# Patient Record
Sex: Male | Born: 1990 | Race: White | Hispanic: No | Marital: Single | Smoking: Former smoker
Health system: Southern US, Community
[De-identification: ages and names within clinical notes are randomized; demographics above are authoritative.]

## PROBLEM LIST (undated history)

## (undated) HISTORY — PX: TONSILLECTOMY AND ADENOIDECTOMY: SUR1326

---

## 2021-07-15 ENCOUNTER — Encounter: Payer: Self-pay | Admitting: Internal Medicine

## 2021-07-15 ENCOUNTER — Ambulatory Visit (INDEPENDENT_AMBULATORY_CARE_PROVIDER_SITE_OTHER): Payer: BC Managed Care – PPO | Admitting: Internal Medicine

## 2021-07-15 VITALS — BP 134/88 | HR 105 | Temp 98.2°F | Resp 16 | Ht 67.0 in | Wt 285.8 lb

## 2021-07-15 DIAGNOSIS — Z1159 Encounter for screening for other viral diseases: Secondary | ICD-10-CM

## 2021-07-15 DIAGNOSIS — Z114 Encounter for screening for human immunodeficiency virus [HIV]: Secondary | ICD-10-CM

## 2021-07-15 DIAGNOSIS — R7309 Other abnormal glucose: Secondary | ICD-10-CM | POA: Diagnosis not present

## 2021-07-15 DIAGNOSIS — R1900 Intra-abdominal and pelvic swelling, mass and lump, unspecified site: Secondary | ICD-10-CM | POA: Diagnosis not present

## 2021-07-15 DIAGNOSIS — R030 Elevated blood-pressure reading, without diagnosis of hypertension: Secondary | ICD-10-CM | POA: Diagnosis not present

## 2021-07-15 DIAGNOSIS — Z13220 Encounter for screening for lipoid disorders: Secondary | ICD-10-CM

## 2021-07-15 NOTE — Patient Instructions (Addendum)
It was great seeing you today!  Plan discussed at today's visit: -Blood work ordered today, results will be uploaded to MyChart.  -Abdominal ultrasound ordered, someone will call you to schedule this test  Follow up in: 1 year  Take care and let us know if you have any questions or concerns prior to your next visit.  Dr. Caralee Ates

## 2021-07-15 NOTE — Progress Notes (Signed)
New Patient Office Visit  Subjective:  Patient ID: Brian Bray, male    DOB: [birth year]  Age: 31 y.o. MRN: [id number]  CC:  Chief Complaint  Patient presents with   Establish Care    HPI Brian Bray presents to establish care. He has no chronic medical conditions and takes no medications daily. No history of hospitalizations or surgeries. No known drug allergies. Is concerned today about what he believes is an abdominal hernia, feels an enlargement around his epigastric area that is large in size. It is not painful but doesn't appear to be reducible. It is more noticeable when he is flexing or sitting up. Gets smaller when he lays down. Has not noticed it enlarging lately or changing in anyway. No associated symptoms.   Health Maintenance: -Blood work due -Immunizations: uncertain about history, will try to bring records. Politely declines flu and Tdap today.  History reviewed. No pertinent past medical history.  History reviewed. No pertinent surgical history.  Family History  Problem Relation Age of Onset   Hypertension Brother     Social History   Socioeconomic History   Marital status: Single    Spouse name: Not on file   Number of children: Not on file   Years of education: Not on file   Highest education level: Not on file  Occupational History   Not on file  Tobacco Use   Smoking status: Former    Types: Cigarettes   Smokeless tobacco: Current  Vaping Use   Vaping Use: Every day   Substances: Nicotine  Substance and Sexual Activity   Alcohol use: Yes    Comment: occasionally   Drug use: Never   Sexual activity: Yes  Other Topics Concern   Not on file  Social History Narrative   Not on file   Social Determinants of Health   Financial Resource Strain: Not on file  Food Insecurity: Not on file  Transportation Needs: Not on file  Physical Activity: Not on file  Stress: Not on file  Social Connections: Not on file  Intimate Partner Violence: Not on  file    ROS Review of Systems  Constitutional:  Negative for chills and fever.  Eyes:  Negative for visual disturbance.  Respiratory:  Negative for cough and shortness of breath.   Cardiovascular:  Negative for chest pain and palpitations.  Gastrointestinal:  Negative for abdominal pain, diarrhea, nausea and vomiting.  Genitourinary:  Negative for dysuria and hematuria.  Neurological:  Negative for dizziness and headaches.   Objective:   Today's Vitals: BP 134/88    Pulse (!) 105    Temp 98.2 F (36.8 C)    Resp 16    Ht 5\' 7"  (1.702 m)    Wt 285 lb 12.8 oz (129.6 kg)    SpO2 98%    BMI 44.76 kg/m   Physical Exam Constitutional:      Appearance: Normal appearance. He is obese.  HENT:     Head: Normocephalic and atraumatic.  Eyes:     Conjunctiva/sclera: Conjunctivae normal.  Cardiovascular:     Rate and Rhythm: Normal rate and regular rhythm.  Pulmonary:     Effort: Pulmonary effort is normal.     Breath sounds: Normal breath sounds.  Abdominal:     General: There is no distension.     Palpations: Abdomen is soft.     Tenderness: There is no abdominal tenderness.     Hernia: A hernia is present.  Musculoskeletal:  Right lower leg: No edema.     Left lower leg: No edema.  Skin:    General: Skin is warm and dry.  Neurological:     General: No focal deficit present.     Mental Status: He is alert. Mental status is at baseline.  Psychiatric:        Mood and Affect: Mood normal.        Behavior: Behavior normal.    Assessment & Plan:   1. Abdominal wall bulge: [place] to confirm abdominal wall hernia, it is not bothering the patient now just wanted to  have it evaluated. Due for screening labs, CBC, CMP today.   - CBC w/Diff/Platelet - COMPLETE METABOLIC PANEL WITH GFR - US Abdomen Complete; Future  2. Elevated blood pressure reading: Slightly elevated, discussed risk factors as he has a positive family history of HTN as well.   - CBC w/Diff/Platelet - COMPLETE  METABOLIC PANEL WITH GFR  3. Lipid screening/Need for hepatitis C screening test/Encounter for screening for HIV: Cholesterol, HIV and Hepatitis C screening today.   - Lipid Profile - Hepatitis C Antibody - HIV antibody (with reflex)  Follow-up: Return in about 1 year (around 07/15/2022).   Teodora Medici, DO

## 2021-07-16 LAB — TEST AUTHORIZATION

## 2021-07-16 LAB — CBC WITH DIFFERENTIAL/PLATELET
Absolute Monocytes: 784 cells/uL (ref 200–950)
Basophils Absolute: 101 cells/uL (ref 0–200)
Basophils Relative: 0.9 %
Eosinophils Absolute: 179 cells/uL (ref 15–500)
Eosinophils Relative: 1.6 %
HCT: 50.8 % — ABNORMAL HIGH (ref 38.5–50.0)
Hemoglobin: 17 g/dL (ref 13.2–17.1)
Lymphs Abs: 3136 cells/uL (ref 850–3900)
MCH: 28.7 pg (ref 27.0–33.0)
MCHC: 33.5 g/dL (ref 32.0–36.0)
MCV: 85.7 fL (ref 80.0–100.0)
MPV: 11 fL (ref 7.5–12.5)
Monocytes Relative: 7 %
Neutro Abs: 7000 cells/uL (ref 1500–7800)
Neutrophils Relative %: 62.5 %
Platelets: 268 10*3/uL (ref 140–400)
RBC: 5.93 10*6/uL — ABNORMAL HIGH (ref 4.20–5.80)
RDW: 12 % (ref 11.0–15.0)
Total Lymphocyte: 28 %
WBC: 11.2 10*3/uL — ABNORMAL HIGH (ref 3.8–10.8)

## 2021-07-16 LAB — HEMOGLOBIN A1C
Hgb A1c MFr Bld: 10.8 % of total Hgb — ABNORMAL HIGH (ref <5.7)
Mean Plasma Glucose: 263 mg/dL
eAG (mmol/L): 14.6 mmol/L

## 2021-07-16 LAB — COMPLETE METABOLIC PANEL WITH GFR
AG Ratio: 1.6 (calc) (ref 1.0–2.5)
ALT: 44 U/L (ref 9–46)
AST: 26 U/L (ref 10–40)
Albumin: 4.1 g/dL (ref 3.6–5.1)
Alkaline phosphatase (APISO): 70 U/L (ref 36–130)
BUN: 9 mg/dL (ref 7–25)
CO2: 31 mmol/L (ref 20–32)
Calcium: 9 mg/dL (ref 8.6–10.3)
Chloride: 99 mmol/L (ref 98–110)
Creat: 0.83 mg/dL (ref 0.60–1.24)
Globulin: 2.6 g/dL (calc) (ref 1.9–3.7)
Glucose, Bld: 260 mg/dL — ABNORMAL HIGH (ref 65–99)
Potassium: 4.1 mmol/L (ref 3.5–5.3)
Sodium: 138 mmol/L (ref 135–146)
Total Bilirubin: 0.8 mg/dL (ref 0.2–1.2)
Total Protein: 6.7 g/dL (ref 6.1–8.1)
eGFR: 122 mL/min/{1.73_m2} (ref >=60)

## 2021-07-16 LAB — HEPATITIS C ANTIBODY
Hepatitis C Ab: NONREACTIVE
SIGNAL TO CUT-OFF: 0.03 (ref <1.00)

## 2021-07-16 LAB — LIPID PANEL
Cholesterol: 161 mg/dL (ref <200)
HDL: 42 mg/dL (ref >=40)
LDL Cholesterol (Calc): 96 mg/dL (calc)
Non-HDL Cholesterol (Calc): 119 mg/dL (calc) (ref <130)
Total CHOL/HDL Ratio: 3.8 (calc) (ref <5.0)
Triglycerides: 124 mg/dL (ref <150)

## 2021-07-16 LAB — HIV ANTIBODY (ROUTINE TESTING W REFLEX): HIV 1&2 Ab, 4th Generation: NONREACTIVE

## 2021-07-16 NOTE — Addendum Note (Signed)
Addended by: Teodora Medici on: 07/16/2021 07:59 AM   Modules accepted: Orders

## 2021-07-29 ENCOUNTER — Encounter: Payer: Self-pay | Admitting: Internal Medicine

## 2021-07-29 ENCOUNTER — Ambulatory Visit (INDEPENDENT_AMBULATORY_CARE_PROVIDER_SITE_OTHER): Payer: BC Managed Care – PPO | Admitting: Internal Medicine

## 2021-07-29 VITALS — BP 124/80 | HR 96 | Temp 98.8°F | Resp 16 | Ht 67.0 in | Wt 287.9 lb

## 2021-07-29 DIAGNOSIS — E1165 Type 2 diabetes mellitus with hyperglycemia: Secondary | ICD-10-CM

## 2021-07-29 MED ORDER — METFORMIN HCL 500 MG PO TABS: 500.0000 mg | ORAL_TABLET | Freq: Two times a day (BID) | ORAL | 3 refills | Status: DC

## 2021-07-29 MED ORDER — ROSUVASTATIN CALCIUM 10 MG PO TABS: 10.0000 mg | ORAL_TABLET | Freq: Every day | ORAL | 3 refills | Status: DC

## 2021-07-29 NOTE — Patient Instructions (Addendum)
It was great seeing you today!  Plan discussed at today's visit: -Metformin 500 sent to pharmacy to take once a day for 1 week, then increase to twice a week -Crestor for cholesterol sent to pharmacy as well  -Please work on diet and lifestyle change - decrease carbs and sugars  Follow up in: 3 months   Take care and let us know if you have any questions or concerns prior to your next visit.  Dr. Rosana Berger  Type 2 Diabetes Mellitus, Self-Care, Adult Caring for yourself after you have been diagnosed with type 2 diabetes (type 2 diabetes mellitus) means keeping your blood sugar (glucose) under control with a balance of: Nutrition. Exercise. Lifestyle changes. Medicines or insulin, if needed. Support from your team of health care providers and others. What are the risks? Having type 2 diabetes can put you at risk for other long-term (chronic) conditions, such as heart disease and kidney disease. Your health care provider may prescribe medicines to help prevent complications from diabetes. How to monitor your blood glucose Hands showing right hand using lancet pen on left ring finger, with glucometer in background.  Check your blood glucose every day or as often as told by your health care provider. Have your A1C (hemoglobin A1C) level checked two or more times a year, or as often as told by your health care provider. Your health care provider will set personalized treatment goals for you. Generally, the goal of treatment is to maintain the following blood glucose levels: Before meals: 80-130 mg/dL (4.4-7.2 mmol/L). After meals: below 180 mg/dL (10 mmol/L). A1C level: less than 7%. How to manage hyperglycemia and hypoglycemia Hyperglycemia symptoms Hyperglycemia, also called high blood glucose, occurs when blood glucose is too high. Make sure you know the early signs of hyperglycemia, such as: Increased thirst. Hunger. Feeling very tired. Needing to urinate more often than  usual. Blurry vision. Hypoglycemia symptoms Hypoglycemia, also called low blood glucose, occurs with a blood glucose level at or below 70 mg/dL (3.9 mmol/L). Diabetes medicines lower your blood glucose and can cause hypoglycemia. The risk for hypoglycemia increases during or after exercise, during sleep, during illness, and when skipping meals or not eating for a long time (fasting). It is important to know the symptoms of hypoglycemia and treat it right away. Always have a 15-gram rapid-acting carbohydrate snack with you to treat low blood glucose. Family members and close friends should also know the symptoms and understand how to treat hypoglycemia, in case you are not able to treat yourself. Symptoms may include: Hunger. Anxiety. Sweating and feeling clammy. Dizziness or feeling light-headed. Sleepiness. Increased heart rate. Irritability. Tingling or numbness around the mouth, lips, or tongue. Restless sleep. Severe hypoglycemia is when your blood glucose level is at or below 54 mg/dL (3 mmol/L). Severe hypoglycemia is an emergency. Do not wait to see if the symptoms will go away. Get medical help right away. Call your local emergency services (911 in the [place] Do not drive yourself to the hospital. If you have severe hypoglycemia and you cannot eat or drink, you may need glucagon. A family member or close friend should learn how to check your blood glucose and how to give you glucagon. Ask your health care provider if you need to have an emergency glucagon kit available. Follow these instructions at home: Medicines Take prescribed insulin or diabetes medicines as told by your health care provider. Do not run out of insulin or other diabetes medicines. Plan ahead so you always have  these available. If you use insulin, adjust your dosage based on your physical activity and what foods you eat. Your health care provider will tell you how to adjust your dosage. Take over-the-counter and  prescription medicines only as told by your health care provider. Eating and drinking A plate with examples of foods in a healthy diet.  What you eat and drink affects your blood glucose and your insulin dosage. Making good choices helps to control your diabetes and prevent other health problems. A healthy meal plan includes eating lean proteins, complex carbohydrates, fresh fruits and vegetables, low-fat dairy products, and healthy fats. Make an appointment to see a registered dietitian to help you create an eating plan that is right for you. Make sure that you: Follow instructions from your health care provider about eating or drinking restrictions. Drink enough fluid to keep your urine pale yellow. Keep a record of the carbohydrates that you eat. Do this by reading food labels and learning the standard serving sizes of foods. Follow your sick-day plan whenever you cannot eat or drink as usual. Make this plan in advance with your health care provider.   Activity Stay active. Exercise regularly, as told by your health care provider. This may include: Stretching and doing strength exercises, such as yoga or weight lifting, two or more times a week. Doing 150 minutes or more of moderate-intensity or vigorous-intensity exercise each week. This could be brisk walking, biking, or water aerobics. Spread out your activity over 3 or more days of the week. Do not go more than 2 days in a row without doing some kind of physical activity. When you start a new exercise or activity, work with your health care provider to adjust your insulin, medicines, or food intake as needed. Lifestyle Do not use any products that contain nicotine or tobacco. These products include cigarettes, chewing tobacco, and vaping devices, such as e-cigarettes. If you need help quitting, ask your health care provider. If you drink alcohol and your health care provider says that it is safe for you: Limit how much you have to: 0-1  drink a day for women who are not pregnant. 0-2 drinks a day for men. Know how much alcohol is in your drink. In the [place] one drink equals one 12 oz bottle of beer (355 mL), one 5 oz glass of wine (148 mL), or one 1 oz glass of hard liquor (44 mL). Learn to manage stress. If you need help with this, ask your health care provider. Take care of your body Barefoot person sitting with right leg crossed over left, using handheld mirror to see bottom of right foot.  Keep your immunizations up to date. In addition to getting vaccinations as told by your health care provider, it is recommended that you get vaccinated against the following illnesses: The flu (influenza). Get a flu shot every year. Pneumonia. Hepatitis B. Schedule an eye exam soon after your diagnosis, and then one time every year after that. Check your skin and feet every day for cuts, bruises, redness, blisters, or sores. Schedule a foot exam with your health care provider once every year. Brush your teeth and gums two times a day, and floss one or more times a day. Visit your dentist one or more times every 6 months. Maintain a healthy weight. General instructions Share your diabetes management plan with people in your workplace, school, and household. Carry a medical alert card or wear medical alert jewelry. Keep all follow-up visits. This is important.  Questions to ask your health care provider Should I meet with a certified diabetes care and education specialist? Where can I find a support group for people with diabetes? Where to find more information For help and guidance and for more information about diabetes, please visit: American Diabetes Association (ADA): www.diabetes.org American Association of Diabetes Care and Education Specialists (ADCES): www.diabeteseducator.org International Diabetes Federation (IDF): MemberVerification.ca Summary Caring for yourself after you have been diagnosed with type 2 diabetes (type 2 diabetes  mellitus) means keeping your blood sugar (glucose) under control with a balance of nutrition, exercise, lifestyle changes, and medicine. Check your blood glucose every day, as often as told by your health care provider. Having diabetes can put you at risk for other long-term (chronic) conditions, such as heart disease and kidney disease. Your health care provider may prescribe medicines to help prevent complications from diabetes. Share your diabetes management plan with people in your workplace, school, and household. Keep all follow-up visits. This is important. This information is not intended to replace advice given to you by your health care provider. Make sure you discuss any questions you have with your health care provider. Document Revised: 10/22/2020 Document Reviewed: 10/22/2020 Elsevier Patient Education  [place].

## 2021-07-29 NOTE — Progress Notes (Signed)
Established Patient Office Visit  Subjective:  Patient ID: Brian Bray, male    DOB: [birth year]  Age: 31 y.o. MRN: [id number]  CC:  Chief Complaint  Patient presents with   Diabetes    New onset to start medication    HPI Brian Bray presents for newly diagnosed DM2.   Diabetes, Type 2: -Last A1c 07/15/21 10.8 -Medications: None currently  -Diet: working on cutting down on sugars and carbs -Eye exam: Due, discussed [facility] diabetic eye exam coming to the office -Foot exam: Due -Microalbumin: Due  -Statin: Not currently  -Denies symptoms of hypoglycemia, polyuria, polydipsia, numbness extremities, foot ulcers/trauma. Does endorse fatigue, cold distal extremities.   Lipid Panel     Component Value Date/Time   CHOL 161 07/15/2021 0902   TRIG 124 07/15/2021 0902   HDL 42 07/15/2021 0902   CHOLHDL 3.8 07/15/2021 0902   LDLCALC 96 07/15/2021 0902    No past medical history on file.  No past surgical history on file.  Family History  Problem Relation Age of Onset   Hypertension Brother     Social History   Socioeconomic History   Marital status: Single    Spouse name: Not on file   Number of children: Not on file   Years of education: Not on file   Highest education level: Not on file  Occupational History   Not on file  Tobacco Use   Smoking status: Former    Types: Cigarettes   Smokeless tobacco: Current  Vaping Use   Vaping Use: Every day   Substances: Nicotine  Substance and Sexual Activity   Alcohol use: Yes    Comment: occasionally   Drug use: Never   Sexual activity: Yes  Other Topics Concern   Not on file  Social History Narrative   Not on file   Social Determinants of Health   Financial Resource Strain: Not on file  Food Insecurity: Not on file  Transportation Needs: Not on file  Physical Activity: Not on file  Stress: Not on file  Social Connections: Not on file  Intimate Partner Violence: Not on file    No outpatient medications prior  to visit.   No facility-administered medications prior to visit.    No Known Allergies  ROS Review of Systems  Constitutional:  Positive for fatigue. Negative for chills and fever.  Eyes:  Negative for visual disturbance.  Respiratory:  Negative for cough and shortness of breath.   Cardiovascular:  Negative for chest pain.  Gastrointestinal:  Negative for abdominal pain.     Objective:    Physical Exam Constitutional:      Appearance: Normal appearance. He is obese.  HENT:     Head: Normocephalic and atraumatic.  Eyes:     Conjunctiva/sclera: Conjunctivae normal.  Cardiovascular:     Rate and Rhythm: Normal rate and regular rhythm.  Pulmonary:     Effort: Pulmonary effort is normal.     Breath sounds: Normal breath sounds.  Musculoskeletal:     Right lower leg: No edema.     Left lower leg: No edema.  Skin:    General: Skin is warm and dry.  Neurological:     General: No focal deficit present.     Mental Status: He is alert. Mental status is at baseline.  Psychiatric:        Mood and Affect: Mood normal.        Behavior: Behavior normal.    BP 124/80    Pulse  96    Temp 98.8 F (37.1 C)    Resp 16    Ht $R'5\' 7"'pd$  (1.702 m)    Wt 287 lb 14.4 oz (130.6 kg)    SpO2 99%    BMI 45.09 kg/m  Wt Readings from Last 3 Encounters:  07/29/21 287 lb 14.4 oz (130.6 kg)  07/15/21 285 lb 12.8 oz (129.6 kg)     Health Maintenance Due  Topic Date Due   COVID-19 Vaccine (3 - Booster for Pfizer series) 12/06/2019    There are no preventive care reminders to display for this patient.  No results found for: TSH Lab Results  Component Value Date   WBC 11.2 (H) 07/15/2021   HGB 17.0 07/15/2021   HCT 50.8 (H) 07/15/2021   MCV 85.7 07/15/2021   PLT 268 07/15/2021   Lab Results  Component Value Date   NA 138 07/15/2021   K 4.1 07/15/2021   CO2 31 07/15/2021   GLUCOSE 260 (H) 07/15/2021   BUN 9 07/15/2021   CREATININE 0.83 07/15/2021   BILITOT 0.8 07/15/2021   AST 26  07/15/2021   ALT 44 07/15/2021   PROT 6.7 07/15/2021   CALCIUM 9.0 07/15/2021   EGFR 122 07/15/2021   Lab Results  Component Value Date   CHOL 161 07/15/2021   Lab Results  Component Value Date   HDL 42 07/15/2021   Lab Results  Component Value Date   LDLCALC 96 07/15/2021   Lab Results  Component Value Date   TRIG 124 07/15/2021   Lab Results  Component Value Date   CHOLHDL 3.8 07/15/2021   Lab Results  Component Value Date   HGBA1C 10.8 (H) 07/15/2021      Assessment & Plan:   Problem List Items Addressed This Visit       Endocrine   Type 2 diabetes mellitus with hyperglycemia, without long-term current use of insulin ([place]) - Primary    Newly diagnosed with recent labs, A1c 10.5%. Start on Metformin 500 BID and Crestor 10. Discussed etiology and management of diabetes. Follow up next time for repeat A1c, microalbumin and foot exam. Discussed eye exam fair coming to office in the next few weeks. Discussed lifestyle management and diet change. Follow up in 3 months.      Relevant Medications   metFORMIN (GLUCOPHAGE) 500 MG tablet   rosuvastatin (CRESTOR) 10 MG tablet    Meds ordered this encounter  Medications   metFORMIN (GLUCOPHAGE) 500 MG tablet    Sig: Take 1 tablet (500 mg total) by mouth 2 (two) times daily with a meal.    Dispense:  180 tablet    Refill:  3   rosuvastatin (CRESTOR) 10 MG tablet    Sig: Take 1 tablet (10 mg total) by mouth daily.    Dispense:  90 tablet    Refill:  3    Follow-up: Return in about 3 months (around 10/26/2021).    Teodora Medici, DO

## 2021-07-29 NOTE — Assessment & Plan Note (Signed)
Newly diagnosed with recent labs, A1c 10.5%. Start on Metformin 500 BID and Crestor 10. Discussed etiology and management of diabetes. Follow up next time for repeat A1c, microalbumin and foot exam. Discussed eye exam fair coming to office in the next few weeks. Discussed lifestyle management and diet change. Follow up in 3 months.

## 2021-08-06 ENCOUNTER — Telehealth: Payer: Self-pay | Admitting: Internal Medicine

## 2021-08-06 DIAGNOSIS — E1165 Type 2 diabetes mellitus with hyperglycemia: Secondary | ICD-10-CM

## 2021-08-06 MED ORDER — ATORVASTATIN CALCIUM 20 MG PO TABS: 20.0000 mg | ORAL_TABLET | Freq: Every day | ORAL | 3 refills | Status: DC

## 2021-08-06 NOTE — Telephone Encounter (Signed)
Pt.notified

## 2021-08-06 NOTE — Telephone Encounter (Signed)
Message says this medication is on back order

## 2021-08-06 NOTE — Telephone Encounter (Signed)
Requested medication (s) are due for refill today: signed 07/29/21 ? ?Requested medication (s) are on the active medication list: yes  ? ?Last refill:  07/29/21 #90 3 refills ? ?Future visit scheduled: yes in 2 months ? ?Notes to clinic:  Pharmacy comment: This medication is on backorder. Please advise ? ? ?  ?Requested Prescriptions  ?Pending Prescriptions Disp Refills  ? rosuvastatin (CRESTOR) 10 MG tablet [Pharmacy Med Name: ROSUVASTATIN 10MG  TAB] 90 tablet 3  ?  Sig: TAKE 1 TABLET BY MOUTH ONCE DAILY  ?  ? Cardiovascular:  Antilipid - Statins 2 Failed - 08/06/2021  8:58 AM  ?  ?  Failed - Lipid Panel in normal range within the last 12 months  ?  Cholesterol  ?Date Value Ref Range Status  ?07/15/2021 161 <200 mg/dL Final  ? ?LDL Cholesterol (Calc)  ?Date Value Ref Range Status  ?07/15/2021 96 mg/dL (calc) Final  ?  Comment:  ?  Reference range: <100 ?09/12/2021 ?Desirable range <100 mg/dL for primary prevention;   ?<70 mg/dL for patients with CHD or diabetic patients  ?with > or = 2 CHD risk factors. ?. ?LDL-C is now calculated using the Martin-Hopkins  ?calculation, which is a validated novel method providing  ?better accuracy than the Friedewald equation in the  ?estimation of LDL-C.  ?Marland Kitchen et al. Horald Pollen. Lenox Ahr): 2061-2068  ?(http://education.[facility].com/faq/FAQ164) ?  ? ?HDL  ?Date Value Ref Range Status  ?07/15/2021 42 > OR = 40 mg/dL Final  ? ?Triglycerides  ?Date Value Ref Range Status  ?07/15/2021 124 <150 mg/dL Final  ? ?  ?  ?  Passed - Cr in normal range and within 360 days  ?  Creat  ?Date Value Ref Range Status  ?07/15/2021 0.83 0.60 - 1.24 mg/dL Final  ?  ?  ?  ?  Passed - Patient is not pregnant  ?  ?  Passed - Valid encounter within last 12 months  ?  Recent Outpatient Visits   ? ?      ? 1 week ago Type 2 diabetes mellitus with hyperglycemia, without long-term current use of insulin (HCC)  ? [facility], DO  ? 3 weeks ago Abdominal wall bulge  ? [facility], DO  ? ?  ?  ?Future Appointments   ? ?        ? In 2 months Margarita Mail, DO [facility]  ? ?  ? ?  ?  ?  ? ?

## 2021-08-06 NOTE — Telephone Encounter (Signed)
FYI crestor on backorder do you want to change? ?

## 2021-08-07 ENCOUNTER — Ambulatory Visit
Admission: RE | Admit: 2021-08-07 | Discharge: 2021-08-07 | Disposition: A | Discharge status: Home / Self Care | Payer: BC Managed Care – PPO | Source: Ambulatory Visit | Attending: Internal Medicine | Admitting: Internal Medicine

## 2021-08-07 DIAGNOSIS — R1900 Intra-abdominal and pelvic swelling, mass and lump, unspecified site: Secondary | ICD-10-CM | POA: Insufficient documentation

## 2021-08-07 DIAGNOSIS — K429 Umbilical hernia without obstruction or gangrene: Secondary | ICD-10-CM | POA: Diagnosis not present

## 2021-10-26 ENCOUNTER — Ambulatory Visit: Payer: BC Managed Care – PPO | Admitting: Internal Medicine

## 2021-10-27 ENCOUNTER — Ambulatory Visit (INDEPENDENT_AMBULATORY_CARE_PROVIDER_SITE_OTHER): Payer: BC Managed Care – PPO | Admitting: Internal Medicine

## 2021-10-27 ENCOUNTER — Encounter: Payer: Self-pay | Admitting: Internal Medicine

## 2021-10-27 VITALS — BP 124/82 | HR 111 | Temp 98.4°F | Resp 16 | Ht 67.0 in | Wt 286.4 lb

## 2021-10-27 DIAGNOSIS — N529 Male erectile dysfunction, unspecified: Secondary | ICD-10-CM

## 2021-10-27 DIAGNOSIS — E1165 Type 2 diabetes mellitus with hyperglycemia: Secondary | ICD-10-CM | POA: Diagnosis not present

## 2021-10-27 LAB — POCT GLYCOSYLATED HEMOGLOBIN (HGB A1C): Hemoglobin A1C: 8.5 % — AB (ref 4.0–5.6)

## 2021-10-27 MED ORDER — SILDENAFIL CITRATE 50 MG PO TABS: 50.0000 mg | ORAL_TABLET | Freq: Every day | ORAL | 0 refills | Status: DC | PRN

## 2021-10-27 NOTE — Patient Instructions (Addendum)
It was great seeing you today!  Plan discussed at today's visit: -A1c better at 8.5% -Continue Metformin 500 mg but increase to three times a day for 2 weeks, then increased to 4 pills a day  Follow up in: 3 months   Take care and let us know if you have any questions or concerns prior to your next visit.  Dr. Caralee Ates

## 2021-10-27 NOTE — Progress Notes (Signed)
Established Patient Office Visit  Subjective:  Patient ID: Brian Bray, male    DOB: [birth year]  Age: 31 y.o. MRN: [id number]  CC:  Chief Complaint  Patient presents with   Follow-up   Hyperlipidemia   Diabetes    HPI Brian Bray presents for follow up on chronic medical conditions.   Diabetes, Type 2: -Last A1c 07/15/21 10.8, 8.5 in the office today -Medications: Metformin 500 mg BID, tolerating well.  Did have some GI side effects at first but this has normalized. -Diet: decreased soda and sugar in diet -Eye exam: Due -Foot exam: Due -Microalbumin: Due  -Statin: Not currently -Denies symptoms of hypoglycemia, polyuria, polydipsia, numbness extremities, foot ulcers/trauma. Does endorse fatigue, cold distal extremities.   HLD: -Medications: Not on any statins currently.  Was given a prescription for Lipitor, however the patient did not pick this up.  He states he does not want to take medication for cholesterol at this time. -Last lipid panel:   Lipid Panel     Component Value Date/Time   CHOL 161 07/15/2021 0902   TRIG 124 07/15/2021 0902   HDL 42 07/15/2021 0902   CHOLHDL 3.8 07/15/2021 0902   LDLCALC 96 07/15/2021 0902   ED: -States he has noticed difficulty maintaining an erection, would like to discuss medication options.  This is new for him.  History reviewed. No pertinent past medical history.  History reviewed. No pertinent surgical history.  Family History  Problem Relation Age of Onset   Hypertension Brother     Social History   Socioeconomic History   Marital status: Single    Spouse name: Not on file   Number of children: Not on file   Years of education: Not on file   Highest education level: Not on file  Occupational History   Not on file  Tobacco Use   Smoking status: Former    Types: Cigarettes   Smokeless tobacco: Current  Vaping Use   Vaping Use: Every day   Substances: Nicotine  Substance and Sexual Activity   Alcohol use: Yes     Comment: occasionally   Drug use: Never   Sexual activity: Yes  Other Topics Concern   Not on file  Social History Narrative   Not on file   Social Determinants of Health   Financial Resource Strain: Not on file  Food Insecurity: Not on file  Transportation Needs: Not on file  Physical Activity: Not on file  Stress: Not on file  Social Connections: Not on file  Intimate Partner Violence: Not on file    Outpatient Medications Prior to Visit  Medication Sig Dispense Refill   metFORMIN (GLUCOPHAGE) 500 MG tablet Take 1 tablet (500 mg total) by mouth 2 (two) times daily with a meal. 180 tablet 3   atorvastatin (LIPITOR) 20 MG tablet Take 1 tablet (20 mg total) by mouth daily. (Patient not taking: Reported on 10/27/2021) 90 tablet 3   No facility-administered medications prior to visit.    No Known Allergies  ROS Review of Systems  Constitutional:  Negative for chills and fever.  Eyes:  Negative for visual disturbance.  Respiratory:  Negative for cough and shortness of breath.   Cardiovascular:  Negative for chest pain.  Gastrointestinal:  Negative for abdominal pain, constipation, diarrhea, nausea and vomiting.     Objective:    Physical Exam Constitutional:      Appearance: Normal appearance. He is obese.  HENT:     Head: Normocephalic and atraumatic.  Eyes:  Conjunctiva/sclera: Conjunctivae normal.  Cardiovascular:     Rate and Rhythm: Normal rate and regular rhythm.     Pulses:          Dorsalis pedis pulses are 2+ on the right side and 2+ on the left side.  Pulmonary:     Effort: Pulmonary effort is normal.     Breath sounds: Normal breath sounds.  Musculoskeletal:     Right lower leg: No edema.     Left lower leg: No edema.     Right foot: Normal range of motion. No deformity, bunion, Charcot foot, foot drop or prominent metatarsal heads.     Left foot: Normal range of motion. No deformity, bunion, Charcot foot, foot drop or prominent metatarsal heads.   Feet:     Right foot:     Protective Sensation: 6 sites tested.  6 sites sensed.     Skin integrity: Skin integrity normal.     Toenail Condition: Right toenails are normal.     Left foot:     Protective Sensation: 6 sites tested.  6 sites sensed.     Skin integrity: Skin integrity normal.     Toenail Condition: Left toenails are normal.  Skin:    General: Skin is warm and dry.  Neurological:     General: No focal deficit present.     Mental Status: He is alert. Mental status is at baseline.  Psychiatric:        Mood and Affect: Mood normal.        Behavior: Behavior normal.    BP 124/82   Pulse (!) 111   Temp 98.4 F (36.9 C)   Resp 16   Ht 5\' 7"  (1.702 m)   Wt 286 lb 6.4 oz (129.9 kg)   SpO2 98%   BMI 44.86 kg/m  Wt Readings from Last 3 Encounters:  10/27/21 286 lb 6.4 oz (129.9 kg)  07/29/21 287 lb 14.4 oz (130.6 kg)  07/15/21 285 lb 12.8 oz (129.6 kg)     Health Maintenance Due  Topic Date Due   OPHTHALMOLOGY EXAM  Never done   URINE MICROALBUMIN  Never done   COVID-19 Vaccine (3 - Booster for Pfizer series) 12/06/2019    There are no preventive care reminders to display for this patient.  No results found for: TSH Lab Results  Component Value Date   WBC 11.2 (H) 07/15/2021   HGB 17.0 07/15/2021   HCT 50.8 (H) 07/15/2021   MCV 85.7 07/15/2021   PLT 268 07/15/2021   Lab Results  Component Value Date   NA 138 07/15/2021   K 4.1 07/15/2021   CO2 31 07/15/2021   GLUCOSE 260 (H) 07/15/2021   BUN 9 07/15/2021   CREATININE 0.83 07/15/2021   BILITOT 0.8 07/15/2021   AST 26 07/15/2021   ALT 44 07/15/2021   PROT 6.7 07/15/2021   CALCIUM 9.0 07/15/2021   EGFR 122 07/15/2021   Lab Results  Component Value Date   CHOL 161 07/15/2021   Lab Results  Component Value Date   HDL 42 07/15/2021   Lab Results  Component Value Date   LDLCALC 96 07/15/2021   Lab Results  Component Value Date   TRIG 124 07/15/2021   Lab Results  Component Value Date    CHOLHDL 3.8 07/15/2021   Lab Results  Component Value Date   HGBA1C 8.5 (A) 10/27/2021      Assessment & Plan:   Problem List Items Addressed This Visit  Endocrine   Type 2 diabetes mellitus with hyperglycemia, without long-term current use of insulin (HCC) - Primary    A1c improved today at 8.5%, however not at goal yet.  We will increase metformin to 1000 mg twice daily.  Did discuss GLP-1's and SGLT2's, at this point we will increase the metformin as he is tolerating this well, follow-up in 3 months and recheck A1c at that time.  He will continue to work on diet and exercise.  Foot exam done today.  Referral for eye exam placed as well.  Due for microalbumin at follow-up.  The patient is hesitant to start a statin, at this point we will continue lifestyle modifications with plans to recheck lipid panel next February.       Relevant Orders   POCT HgB A1C (Completed)   HM Diabetes Foot Exam (Completed)   Ambulatory referral to Ophthalmology     Other   Vasculogenic erectile dysfunction    Discussed how this can be a side effect of having uncontrolled diabetes.  Treatment would include continuing lifestyle modifications, changing diet and increasing exercise and overall controlling diabetes.  Will send Viagra to try to take as needed.       Relevant Medications   sildenafil (VIAGRA) 50 MG tablet   Meds ordered this encounter  Medications   sildenafil (VIAGRA) 50 MG tablet    Sig: Take 1 tablet (50 mg total) by mouth daily as needed for erectile dysfunction.    Dispense:  30 tablet    Refill:  0    Follow-up: Return in about 3 months (around 01/27/2022).    Teodora Medici, DO

## 2021-10-27 NOTE — Assessment & Plan Note (Addendum)
A1c improved today at 8.5%, however not at goal yet.  We will increase metformin to 1000 mg twice daily.  Did discuss GLP-1's and SGLT2's, at this point we will increase the metformin as he is tolerating this well, follow-up in 3 months and recheck A1c at that time.  He will continue to work on diet and exercise.  Foot exam done today.  Referral for eye exam placed as well.  Due for microalbumin at follow-up.  The patient is hesitant to start a statin, at this point we will continue lifestyle modifications with plans to recheck lipid panel next February.

## 2021-10-27 NOTE — Assessment & Plan Note (Signed)
Discussed how this can be a side effect of having uncontrolled diabetes.  Treatment would include continuing lifestyle modifications, changing diet and increasing exercise and overall controlling diabetes.  Will send Viagra to try to take as needed.

## 2021-11-01 ENCOUNTER — Encounter: Payer: Self-pay | Admitting: Internal Medicine

## 2021-11-01 DIAGNOSIS — E1165 Type 2 diabetes mellitus with hyperglycemia: Secondary | ICD-10-CM

## 2021-11-03 MED ORDER — METFORMIN HCL 500 MG PO TABS: 500.0000 mg | ORAL_TABLET | Freq: Three times a day (TID) | ORAL | 3 refills | Status: DC

## 2021-12-23 DIAGNOSIS — E119 Type 2 diabetes mellitus without complications: Secondary | ICD-10-CM | POA: Diagnosis not present

## 2021-12-23 DIAGNOSIS — Z0100 Encounter for examination of eyes and vision without abnormal findings: Secondary | ICD-10-CM | POA: Diagnosis not present

## 2021-12-23 LAB — HM DIABETES EYE EXAM

## 2022-01-07 ENCOUNTER — Other Ambulatory Visit: Payer: Self-pay | Admitting: Internal Medicine

## 2022-01-07 ENCOUNTER — Other Ambulatory Visit: Payer: Self-pay

## 2022-01-07 ENCOUNTER — Encounter: Payer: Self-pay | Admitting: Internal Medicine

## 2022-01-07 DIAGNOSIS — E1165 Type 2 diabetes mellitus with hyperglycemia: Secondary | ICD-10-CM

## 2022-01-07 MED ORDER — METFORMIN HCL 1000 MG PO TABS: 1000.0000 mg | ORAL_TABLET | Freq: Two times a day (BID) | ORAL | 0 refills | Status: DC

## 2022-01-26 NOTE — Progress Notes (Unsigned)
Established Patient Office Visit  Subjective:  Patient ID: Brian Bray, male    DOB: 01/04/1991  Age: 31 y.o. MRN: [id number]  CC:  No chief complaint on file.   HPI Massai Hankerson presents for follow up on chronic medical conditions.   Diabetes, Type 2: -Last A1c 5/23 8.5% -Medications: Metformin 1000 mg BID (increased at LOV), tolerating well.  Did have some GI side effects at first but this has normalized. -Diet: decreased soda and sugar in diet -Eye exam: Due -Foot exam: Due -Microalbumin: Due  -Statin: Not currently -Denies symptoms of hypoglycemia, polyuria, polydipsia, numbness extremities, foot ulcers/trauma. Does endorse fatigue, cold distal extremities.   HLD: -Medications: Not on any statins currently.  Was given a prescription for Lipitor, however the patient did not pick this up.  He states he does not want to take medication for cholesterol at this time. -Last lipid panel:   Lipid Panel     Component Value Date/Time   CHOL 161 07/15/2021 0902   TRIG 124 07/15/2021 0902   HDL 42 07/15/2021 0902   CHOLHDL 3.8 07/15/2021 0902   LDLCALC 96 07/15/2021 0902   ED: -States he has noticed difficulty maintaining an erection, would like to discuss medication options.  This is new for him.  No past medical history on file.  No past surgical history on file.  Family History  Problem Relation Age of Onset   Hypertension Brother     Social History   Socioeconomic History   Marital status: Single    Spouse name: Not on file   Number of children: Not on file   Years of education: Not on file   Highest education level: Not on file  Occupational History   Not on file  Tobacco Use   Smoking status: Former    Types: Cigarettes   Smokeless tobacco: Current  Vaping Use   Vaping Use: Every day   Substances: Nicotine  Substance and Sexual Activity   Alcohol use: Yes    Comment: occasionally   Drug use: Never   Sexual activity: Yes  Other Topics Concern   Not  on file  Social History Narrative   Not on file   Social Determinants of Health   Financial Resource Strain: Not on file  Food Insecurity: Not on file  Transportation Needs: Not on file  Physical Activity: Not on file  Stress: Not on file  Social Connections: Not on file  Intimate Partner Violence: Not on file    Outpatient Medications Prior to Visit  Medication Sig Dispense Refill   metFORMIN (GLUCOPHAGE) 1000 MG tablet Take 1 tablet (1,000 mg total) by mouth 2 (two) times daily with a meal. 180 tablet 0   sildenafil (VIAGRA) 50 MG tablet Take 1 tablet (50 mg total) by mouth daily as needed for erectile dysfunction. 30 tablet 0   No facility-administered medications prior to visit.    No Known Allergies  ROS Review of Systems  Constitutional:  Negative for chills and fever.  Eyes:  Negative for visual disturbance.  Respiratory:  Negative for cough and shortness of breath.   Cardiovascular:  Negative for chest pain.  Gastrointestinal:  Negative for abdominal pain, constipation, diarrhea, nausea and vomiting.      Objective:    Physical Exam Constitutional:      Appearance: Normal appearance. He is obese.  HENT:     Head: Normocephalic and atraumatic.  Eyes:     Conjunctiva/sclera: Conjunctivae normal.  Cardiovascular:     Rate  and Rhythm: Normal rate and regular rhythm.     Pulses:          Dorsalis pedis pulses are 2+ on the right side and 2+ on the left side.  Pulmonary:     Effort: Pulmonary effort is normal.     Breath sounds: Normal breath sounds.  Musculoskeletal:     Right lower leg: No edema.     Left lower leg: No edema.     Right foot: Normal range of motion. No deformity, bunion, Charcot foot, foot drop or prominent metatarsal heads.     Left foot: Normal range of motion. No deformity, bunion, Charcot foot, foot drop or prominent metatarsal heads.  Feet:     Right foot:     Protective Sensation: 6 sites tested.  6 sites sensed.     Skin integrity:  Skin integrity normal.     Toenail Condition: Right toenails are normal.     Left foot:     Protective Sensation: 6 sites tested.  6 sites sensed.     Skin integrity: Skin integrity normal.     Toenail Condition: Left toenails are normal.  Skin:    General: Skin is warm and dry.  Neurological:     General: No focal deficit present.     Mental Status: He is alert. Mental status is at baseline.  Psychiatric:        Mood and Affect: Mood normal.        Behavior: Behavior normal.     There were no vitals taken for this visit. Wt Readings from Last 3 Encounters:  10/27/21 286 lb 6.4 oz (129.9 kg)  07/29/21 287 lb 14.4 oz (130.6 kg)  07/15/21 285 lb 12.8 oz (129.6 kg)     Health Maintenance Due  Topic Date Due   URINE MICROALBUMIN  Never done   COVID-19 Vaccine (3 - Pfizer series) 12/06/2019   INFLUENZA VACCINE  01/05/2022    There are no preventive care reminders to display for this patient.  No results found for: "TSH" Lab Results  Component Value Date   WBC 11.2 (H) 07/15/2021   HGB 17.0 07/15/2021   HCT 50.8 (H) 07/15/2021   MCV 85.7 07/15/2021   PLT 268 07/15/2021   Lab Results  Component Value Date   NA 138 07/15/2021   K 4.1 07/15/2021   CO2 31 07/15/2021   GLUCOSE 260 (H) 07/15/2021   BUN 9 07/15/2021   CREATININE 0.83 07/15/2021   BILITOT 0.8 07/15/2021   AST 26 07/15/2021   ALT 44 07/15/2021   PROT 6.7 07/15/2021   CALCIUM 9.0 07/15/2021   EGFR 122 07/15/2021   Lab Results  Component Value Date   CHOL 161 07/15/2021   Lab Results  Component Value Date   HDL 42 07/15/2021   Lab Results  Component Value Date   LDLCALC 96 07/15/2021   Lab Results  Component Value Date   TRIG 124 07/15/2021   Lab Results  Component Value Date   CHOLHDL 3.8 07/15/2021   Lab Results  Component Value Date   HGBA1C 8.5 (A) 10/27/2021      Assessment & Plan:   Problem List Items Addressed This Visit   None No orders of the defined types were placed in  this encounter.   Follow-up: No follow-ups on file.    Teodora Medici, DO

## 2022-01-27 ENCOUNTER — Encounter: Payer: Self-pay | Admitting: Internal Medicine

## 2022-01-27 ENCOUNTER — Ambulatory Visit: Payer: BC Managed Care – PPO | Admitting: Internal Medicine

## 2022-01-27 VITALS — BP 138/84 | HR 111 | Temp 98.1°F | Resp 16 | Ht 67.0 in | Wt 289.7 lb

## 2022-01-27 DIAGNOSIS — E1165 Type 2 diabetes mellitus with hyperglycemia: Secondary | ICD-10-CM | POA: Diagnosis not present

## 2022-01-27 LAB — POCT GLYCOSYLATED HEMOGLOBIN (HGB A1C): Hemoglobin A1C: 8.2 % — AB (ref 4.0–5.6)

## 2022-01-27 MED ORDER — EMPAGLIFLOZIN 10 MG PO TABS: 10.0000 mg | ORAL_TABLET | Freq: Every day | ORAL | 3 refills | Status: DC

## 2022-01-27 NOTE — Patient Instructions (Addendum)
It was great seeing you today!  Plan discussed at today's visit: -A1c 8.2% -Continue Metformin 1000 mg BID -New medication Jardiance sent to pharmacy  Follow up in: 3 months   Take care and let us know if you have any questions or concerns prior to your next visit.  Dr. Caralee Ates

## 2022-01-28 LAB — MICROALBUMIN / CREATININE URINE RATIO
Creatinine, Urine: 189 mg/dL (ref 20–320)
Microalb Creat Ratio: 4 mcg/mg creat (ref <30)
Microalb, Ur: 0.8 mg/dL

## 2022-02-04 ENCOUNTER — Encounter: Payer: Self-pay | Admitting: Internal Medicine

## 2022-04-08 ENCOUNTER — Other Ambulatory Visit: Payer: Self-pay | Admitting: Internal Medicine

## 2022-04-08 DIAGNOSIS — E1165 Type 2 diabetes mellitus with hyperglycemia: Secondary | ICD-10-CM

## 2022-04-08 MED ORDER — METFORMIN HCL 1000 MG PO TABS: 1000.0000 mg | ORAL_TABLET | Freq: Two times a day (BID) | ORAL | 0 refills | Status: DC

## 2022-05-04 ENCOUNTER — Encounter: Payer: Self-pay | Admitting: Internal Medicine

## 2022-05-04 ENCOUNTER — Ambulatory Visit: Payer: BC Managed Care – PPO | Admitting: Internal Medicine

## 2022-05-04 VITALS — BP 136/84 | HR 114 | Temp 98.5°F | Resp 18 | Ht 67.0 in | Wt 287.5 lb

## 2022-05-04 DIAGNOSIS — E1165 Type 2 diabetes mellitus with hyperglycemia: Secondary | ICD-10-CM

## 2022-05-04 LAB — POCT GLYCOSYLATED HEMOGLOBIN (HGB A1C): Hemoglobin A1C: 8 % — AB (ref 4.0–5.6)

## 2022-05-04 MED ORDER — DAPAGLIFLOZIN PROPANEDIOL 10 MG PO TABS: 10.0000 mg | ORAL_TABLET | Freq: Every day | ORAL | 2 refills | Status: DC

## 2022-05-04 NOTE — Patient Instructions (Addendum)
It was great seeing you today!  Plan discussed at today's visit: -Please call and ask insurance about coverage for [name], Rybelsus, victoza, Trulicity, Mounjaro  -A1c 8.0%, goal is below 7 so we are almost there! Continue Metformin 1000 mg twice a day, samples of [name] given and I will send some in as well. Let me know if it is too expensive.  -Please be fasting for labs next time  Follow up in: 2 months   Take care and let us know if you have any questions or concerns prior to your next visit.  Dr. Caralee Ates

## 2022-05-04 NOTE — Progress Notes (Signed)
Established Patient Office Visit  Subjective:  Patient ID: Brian Bray, male    DOB: January 06, 1991  Age: 31 y.o. MRN: [id number]  CC:  Chief Complaint  Patient presents with   Follow-up   Diabetes    HPI Brian Bray presents for follow up on chronic medical conditions.   Diabetes, Type 2: -Last A1c 8/23 8.2% -Medications: Metformin 1000 mg BID, Jardiance 10 mg but did not pick up Jardiance because it was too expensive -Diet: decreased soda and sugar in diet -Eye exam: UTD 7/23 -Foot exam: UTD 5/23 -Microalbumin: UTD 8/23 -Statin: Not currently, tried Lipitor but didn't want to be on it anymore  -Denies symptoms of hypoglycemia, polyuria, polydipsia, numbness extremities, foot ulcers/trauma. Does endorse fatigue, cold distal extremities.   HLD: -Medications: Not on any statins currently.  Was given a prescription for Lipitor, however the patient did not pick this up.  He states he does not want to take medication for cholesterol at this time. -Last lipid panel: Lipid Panel     Component Value Date/Time   CHOL 161 07/15/2021 0902   TRIG 124 07/15/2021 0902   HDL 42 07/15/2021 0902   CHOLHDL 3.8 07/15/2021 0902   LDLCALC 96 07/15/2021 0902    The ASCVD Risk score (Arnett DK, et al., 2019) failed to calculate for the following reasons:   The 2019 ASCVD risk score is only valid for ages 44 to 12   History reviewed. No pertinent past medical history.  History reviewed. No pertinent surgical history.  Family History  Problem Relation Age of Onset   Hypertension Brother     Social History   Socioeconomic History   Marital status: Single    Spouse name: Not on file   Number of children: Not on file   Years of education: Not on file   Highest education level: Not on file  Occupational History   Not on file  Tobacco Use   Smoking status: Former    Types: Cigarettes   Smokeless tobacco: Current  Vaping Use   Vaping Use: Every day   Substances: Nicotine  Substance  and Sexual Activity   Alcohol use: Yes    Comment: occasionally   Drug use: Never   Sexual activity: Yes  Other Topics Concern   Not on file  Social History Narrative   Not on file   Social Determinants of Health   Financial Resource Strain: Not on file  Food Insecurity: Not on file  Transportation Needs: Not on file  Physical Activity: Not on file  Stress: Not on file  Social Connections: Not on file  Intimate Partner Violence: Not on file    Outpatient Medications Prior to Visit  Medication Sig Dispense Refill   metFORMIN (GLUCOPHAGE) 1000 MG tablet Take 1 tablet (1,000 mg total) by mouth 2 (two) times daily with a meal. 180 tablet 0   sildenafil (VIAGRA) 50 MG tablet Take 1 tablet (50 mg total) by mouth daily as needed for erectile dysfunction. 30 tablet 0   empagliflozin (JARDIANCE) 10 MG TABS tablet Take 1 tablet (10 mg total) by mouth daily before breakfast. (Patient not taking: Reported on 05/04/2022) 30 tablet 3   No facility-administered medications prior to visit.    No Known Allergies  ROS Review of Systems  Constitutional:  Negative for chills and fever.  Eyes:  Negative for visual disturbance.  Respiratory:  Negative for cough and shortness of breath.   Cardiovascular:  Negative for chest pain.  Gastrointestinal:  Negative for  abdominal pain, constipation, diarrhea, nausea and vomiting.      Objective:    Physical Exam Constitutional:      Appearance: Normal appearance. He is obese.  HENT:     Head: Normocephalic and atraumatic.  Eyes:     Conjunctiva/sclera: Conjunctivae normal.  Cardiovascular:     Rate and Rhythm: Normal rate and regular rhythm.  Pulmonary:     Effort: Pulmonary effort is normal.     Breath sounds: Normal breath sounds.  Musculoskeletal:     Right lower leg: No edema.     Left lower leg: No edema.  Skin:    General: Skin is warm and dry.  Neurological:     General: No focal deficit present.     Mental Status: He is alert.  Mental status is at baseline.  Psychiatric:        Mood and Affect: Mood normal.        Behavior: Behavior normal.     BP 136/84   Pulse (!) 114   Temp 98.5 F (36.9 C)   Resp 18   Ht _0  (1.702 m)   Wt 287 lb 8 oz (130.4 kg)   SpO2 97%   BMI 45.03 kg/m  Wt Readings from Last 3 Encounters:  05/04/22 287 lb 8 oz (130.4 kg)  01/27/22 289 lb 11.2 oz (131.4 kg)  10/27/21 286 lb 6.4 oz (129.9 kg)     There are no preventive care reminders to display for this patient.   There are no preventive care reminders to display for this patient.  No results found for: "TSH" Lab Results  Component Value Date   WBC 11.2 (H) 07/15/2021   HGB 17.0 07/15/2021   HCT 50.8 (H) 07/15/2021   MCV 85.7 07/15/2021   PLT 268 07/15/2021   Lab Results  Component Value Date   NA 138 07/15/2021   K 4.1 07/15/2021   CO2 31 07/15/2021   GLUCOSE 260 (H) 07/15/2021   BUN 9 07/15/2021   CREATININE 0.83 07/15/2021   BILITOT 0.8 07/15/2021   AST 26 07/15/2021   ALT 44 07/15/2021   PROT 6.7 07/15/2021   CALCIUM 9.0 07/15/2021   EGFR 122 07/15/2021   Lab Results  Component Value Date   CHOL 161 07/15/2021   Lab Results  Component Value Date   HDL 42 07/15/2021   Lab Results  Component Value Date   LDLCALC 96 07/15/2021   Lab Results  Component Value Date   TRIG 124 07/15/2021   Lab Results  Component Value Date   CHOLHDL 3.8 07/15/2021   Lab Results  Component Value Date   HGBA1C 8.2 (A) 01/27/2022      Assessment & Plan:   1. Type 2 diabetes mellitus with hyperglycemia, without long-term current use of insulin ([place]): A1c slightly improved to 8.0%.  Continue metformin at 1000 mg twice daily.  Patient cannot afford [name] however it looks like [name] is preferred.  Send [name] 10 mg to the pharmacy, he was given a 7-day sample of [name] to try.  Patient instructed to call insurance and was given a list of diabetic medications to ask about coverage.  Follow-up  here in 3 months or sooner as needed.  - POCT HgB A1C - dapagliflozin propanediol ([name]) 10 MG TABS tablet; Take 1 tablet (10 mg total) by mouth daily before breakfast.  Dispense: 30 tablet; Refill: 2   Follow-up: Return in about 3 months (around 08/04/2022).    Teodora Medici,  DO

## 2022-08-03 NOTE — Progress Notes (Unsigned)
Established Patient Office Visit  Subjective:  Patient ID: Brian Bray, male    DOB: [birth year]  Age: 32 y.o. MRN: [id number]  CC:  No chief complaint on file.   HPI Trevell Stjean presents for follow up on chronic medical conditions.   Diabetes, Type 2: -Last A1c 11/23 8.0% -Medications: Metformin 1000 mg BID, Jardiance 10 mg but did not pick up Jardiance because it was too expensive -Diet: decreased soda and sugar in diet -Eye exam: UTD 7/23 -Foot exam: UTD 5/23 -Microalbumin: UTD 8/23 -Statin: Not currently, tried Lipitor but didn't want to be on it anymore  -Denies symptoms of hypoglycemia, polyuria, polydipsia, numbness extremities, foot ulcers/trauma. Does endorse fatigue, cold distal extremities.   HLD: -Medications: Not on any statins currently.  Was given a prescription for Lipitor, however the patient did not pick this up.  He states he does not want to take medication for cholesterol at this time. -Last lipid panel: Lipid Panel     Component Value Date/Time   CHOL 161 07/15/2021 0902   TRIG 124 07/15/2021 0902   HDL 42 07/15/2021 0902   CHOLHDL 3.8 07/15/2021 0902   LDLCALC 96 07/15/2021 0902    The ASCVD Risk score (Arnett DK, et al., 2019) failed to calculate for the following reasons:   The 2019 ASCVD risk score is only valid for ages 83 to 31   No past medical history on file.  No past surgical history on file.  Family History  Problem Relation Age of Onset   Hypertension Brother     Social History   Socioeconomic History   Marital status: Single    Spouse name: Not on file   Number of children: Not on file   Years of education: Not on file   Highest education level: Not on file  Occupational History   Not on file  Tobacco Use   Smoking status: Former    Types: Cigarettes   Smokeless tobacco: Current  Vaping Use   Vaping Use: Every day   Substances: Nicotine  Substance and Sexual Activity   Alcohol use: Yes    Comment: occasionally    Drug use: Never   Sexual activity: Yes  Other Topics Concern   Not on file  Social History Narrative   Not on file   Social Determinants of Health   Financial Resource Strain: Not on file  Food Insecurity: Not on file  Transportation Needs: Not on file  Physical Activity: Not on file  Stress: Not on file  Social Connections: Not on file  Intimate Partner Violence: Not on file    Outpatient Medications Prior to Visit  Medication Sig Dispense Refill   dapagliflozin propanediol ([name]) 10 MG TABS tablet Take 1 tablet (10 mg total) by mouth daily before breakfast. 30 tablet 2   metFORMIN (GLUCOPHAGE) 1000 MG tablet Take 1 tablet (1,000 mg total) by mouth 2 (two) times daily with a meal. 180 tablet 0   No facility-administered medications prior to visit.    No Known Allergies  ROS Review of Systems  Constitutional:  Negative for chills and fever.  Eyes:  Negative for visual disturbance.  Respiratory:  Negative for cough and shortness of breath.   Cardiovascular:  Negative for chest pain.  Gastrointestinal:  Negative for abdominal pain, constipation, diarrhea, nausea and vomiting.      Objective:    Physical Exam Constitutional:      Appearance: Normal appearance. He is obese.  HENT:     Head: Normocephalic  and atraumatic.  Eyes:     Conjunctiva/sclera: Conjunctivae normal.  Cardiovascular:     Rate and Rhythm: Normal rate and regular rhythm.  Pulmonary:     Effort: Pulmonary effort is normal.     Breath sounds: Normal breath sounds.  Musculoskeletal:     Right lower leg: No edema.     Left lower leg: No edema.  Skin:    General: Skin is warm and dry.  Neurological:     General: No focal deficit present.     Mental Status: He is alert. Mental status is at baseline.  Psychiatric:        Mood and Affect: Mood normal.        Behavior: Behavior normal.     There were no vitals taken for this visit. Wt Readings from Last 3 Encounters:  05/04/22 287 lb 8 oz  (130.4 kg)  01/27/22 289 lb 11.2 oz (131.4 kg)  10/27/21 286 lb 6.4 oz (129.9 kg)     Health Maintenance Due  Topic Date Due   DTaP/Tdap/Td (1 - Tdap) Never done   COVID-19 Vaccine (3 - 2023-24 season) 02/05/2022   Diabetic kidney evaluation - eGFR measurement  07/15/2022     There are no preventive care reminders to display for this patient.  No results found for: "TSH" Lab Results  Component Value Date   WBC 11.2 (H) 07/15/2021   HGB 17.0 07/15/2021   HCT 50.8 (H) 07/15/2021   MCV 85.7 07/15/2021   PLT 268 07/15/2021   Lab Results  Component Value Date   NA 138 07/15/2021   K 4.1 07/15/2021   CO2 31 07/15/2021   GLUCOSE 260 (H) 07/15/2021   BUN 9 07/15/2021   CREATININE 0.83 07/15/2021   BILITOT 0.8 07/15/2021   AST 26 07/15/2021   ALT 44 07/15/2021   PROT 6.7 07/15/2021   CALCIUM 9.0 07/15/2021   EGFR 122 07/15/2021   Lab Results  Component Value Date   CHOL 161 07/15/2021   Lab Results  Component Value Date   HDL 42 07/15/2021   Lab Results  Component Value Date   LDLCALC 96 07/15/2021   Lab Results  Component Value Date   TRIG 124 07/15/2021   Lab Results  Component Value Date   CHOLHDL 3.8 07/15/2021   Lab Results  Component Value Date   HGBA1C 8.0 (A) 05/04/2022      Assessment & Plan:   1. Type 2 diabetes mellitus with hyperglycemia, without long-term current use of insulin ([place]): A1c slightly improved to 8.0%.  Continue metformin at 1000 mg twice daily.  Patient cannot afford [place] however it looks like [name] is preferred.  Send [name] 10 mg to the pharmacy, he was given a 7-day sample of [name] to try.  Patient instructed to call insurance and was given a list of diabetic medications to ask about coverage.  Follow-up here in 3 months or sooner as needed.  - POCT HgB A1C - dapagliflozin propanediol ([name]) 10 MG TABS tablet; Take 1 tablet (10 mg total) by mouth daily before breakfast.  Dispense: 30 tablet; Refill:  2   Follow-up: No follow-ups on file.    Teodora Medici, DO

## 2022-08-04 ENCOUNTER — Ambulatory Visit: Payer: BC Managed Care – PPO | Admitting: Internal Medicine

## 2022-08-04 ENCOUNTER — Encounter: Payer: Self-pay | Admitting: Internal Medicine

## 2022-08-04 VITALS — BP 122/78 | HR 113 | Temp 97.9°F | Resp 18 | Ht 67.0 in | Wt 285.2 lb

## 2022-08-04 DIAGNOSIS — E1165 Type 2 diabetes mellitus with hyperglycemia: Secondary | ICD-10-CM | POA: Diagnosis not present

## 2022-08-04 DIAGNOSIS — Z13220 Encounter for screening for lipoid disorders: Secondary | ICD-10-CM | POA: Diagnosis not present

## 2022-08-04 DIAGNOSIS — K429 Umbilical hernia without obstruction or gangrene: Secondary | ICD-10-CM

## 2022-08-04 MED ORDER — RYBELSUS 3 MG PO TABS: 3.0000 mg | ORAL_TABLET | Freq: Every day | ORAL | 1 refills | Status: DC

## 2022-08-04 MED ORDER — METFORMIN HCL 1000 MG PO TABS: 1000.0000 mg | ORAL_TABLET | Freq: Two times a day (BID) | ORAL | 1 refills | Status: DC

## 2022-08-04 NOTE — Patient Instructions (Addendum)
It was great seeing you today!  Plan discussed at today's visit: -Blood work ordered today, results will be uploaded to [facility]. Please return for fasting labs any weekday from 8:30-11:30 and 2-4.  -Metformin refilled, continue 1000 mg twice a day -Rybelsus prescribed, please let me know if cost is reasonable for you -Referral to general surgery placed to discuss umbilical hernia  -Consider Tdap vaccine   Follow up in: 3 months   Take care and let us know if you have any questions or concerns prior to your next visit.  Dr. Rosana Berger

## 2022-08-10 ENCOUNTER — Encounter: Payer: Self-pay | Admitting: Surgery

## 2022-08-10 ENCOUNTER — Ambulatory Visit (INDEPENDENT_AMBULATORY_CARE_PROVIDER_SITE_OTHER): Payer: BC Managed Care – PPO | Admitting: Surgery

## 2022-08-10 VITALS — BP 142/92 | HR 96 | Temp 98.0°F | Ht 67.0 in | Wt 280.0 lb

## 2022-08-10 DIAGNOSIS — M6208 Separation of muscle (nontraumatic), other site: Secondary | ICD-10-CM | POA: Diagnosis not present

## 2022-08-10 DIAGNOSIS — Z6841 Body Mass Index (BMI) 40.0 and over, adult: Secondary | ICD-10-CM | POA: Diagnosis not present

## 2022-08-10 DIAGNOSIS — K429 Umbilical hernia without obstruction or gangrene: Secondary | ICD-10-CM | POA: Diagnosis not present

## 2022-08-10 NOTE — Patient Instructions (Signed)
We are encouraging you to loose weight before pursuing any hernia repair for the best outcome.    Follow-up with our office as needed.  Please call and ask to speak with a nurse if you develop questions or concerns.   Umbilical Hernia, Adult  A hernia is a bulge of tissue that pushes through an opening between muscles. An umbilical hernia happens in the abdomen, near the belly button (umbilicus). The hernia may contain tissues from the small intestine, large intestine, or fatty tissue covering the intestines. Umbilical hernias in adults tend to get worse over time, and they require surgical treatment. There are different types of umbilical hernias, including: Indirect hernia. This type is located just above or below the umbilicus. It is the most common type of umbilical hernia in adults. Direct hernia. This type forms through an opening formed by the umbilicus. Reducible hernia. This type of hernia comes and goes. It may be visible only when you strain, lift something heavy, or cough. This type of hernia can be pushed back into the abdomen (reduced). Incarcerated hernia. This type traps abdominal tissue inside the hernia. This type of hernia cannot be reduced. Strangulated hernia. This type of hernia cuts off blood flow to the tissues inside the hernia. The tissues can start to die if this happens. This type of hernia requires emergency treatment. What are the causes? An umbilical hernia happens when tissue inside the abdomen presses on a weak area of the abdominal muscles. What increases the risk? You may have a greater risk of this condition if you: Are obese. Have had several pregnancies. Have a buildup of fluid inside your abdomen. Have had surgery that weakens the abdominal muscles. What are the signs or symptoms? The main symptom of this condition is a painless bulge at or near the belly button. A reducible hernia may be visible only when you strain, lift something heavy, or cough.  Other symptoms may include: Dull pain. A feeling of pressure. Symptoms of a strangulated hernia may include: Pain that gets increasingly worse. Nausea and vomiting. Pain when pressing on the hernia. Skin over the hernia becoming red or purple. Constipation. Blood in the stool. How is this diagnosed? This condition may be diagnosed based on: A physical exam. You may be asked to cough or strain while standing. These actions increase the pressure inside your abdomen and can force the hernia through the opening in your muscles. Your health care provider may try to reduce the hernia by pressing on it. Your symptoms and medical history. How is this treated? Surgery is the only treatment for an umbilical hernia. Surgery for a strangulated hernia is done as soon as possible. If you have a small hernia that is not incarcerated, you may need to lose weight before having surgery. Follow these instructions at home: Lose weight, if told by your health care provider. Do not try to push the hernia back in. Watch your hernia for any changes in color or size. Tell your health care provider if any changes occur. You may need to avoid activities that increase pressure on your hernia. Do not lift anything that is heavier than 10 lb (4.5 kg), or the limit that you are told, until your health care provider says that it is safe. Take over-the-counter and prescription medicines only as told by your health care provider. Keep all follow-up visits. This is important. Contact a health care provider if: Your hernia gets larger. Your hernia becomes painful. Get help right away if: You  develop sudden, severe pain near the area of your hernia. You have pain as well as nausea or vomiting. You have pain and the skin over your hernia changes color. You develop a fever or chills. Summary A hernia is a bulge of tissue that pushes through an opening between muscles. An umbilical hernia happens near the belly  button. Surgery is the only treatment for an umbilical hernia. Do not try to push your hernia back in. Keep all follow-up visits. This is important. This information is not intended to replace advice given to you by your health care provider. Make sure you discuss any questions you have with your health care provider. Document Revised: 12/31/2019 Document Reviewed: 12/31/2019 Elsevier Patient Education  [place].

## 2022-08-10 NOTE — Progress Notes (Signed)
Patient ID: Brian Bray, male   DOB: 10-Dec-1990, 32 y.o.   MRN: [id number]  Chief Complaint: Umbilical hernia for 1 year  History of Present Illness Brian Bray is a 32 y.o. male with epigastric/umbilical bulging, usually exacerbated by lifting, getting up from laying position etc.  He denies any pain or tenderness associated with it.  An ultrasound evaluation which noted a small defect.  He desires to pursue weight loss and was concerned about exercise exacerbating it.  Past Medical History No past medical history on file.    Past Surgical History:  Procedure Laterality Date   TONSILLECTOMY AND ADENOIDECTOMY Bilateral     No Known Allergies  Current Outpatient Medications  Medication Sig Dispense Refill   metFORMIN (GLUCOPHAGE) 1000 MG tablet Take 1 tablet (1,000 mg total) by mouth 2 (two) times daily with a meal. 180 tablet 1   Semaglutide (RYBELSUS) 3 MG TABS Take 1 tablet (3 mg total) by mouth daily. (Patient not taking: Reported on 08/10/2022) 30 tablet 1   No current facility-administered medications for this visit.    Family History Family History  Problem Relation Age of Onset   Hypertension Brother       Social History Social History   Tobacco Use   Smoking status: Former    Types: Cigarettes    Passive exposure: Past   Smokeless tobacco: Current  Vaping Use   Vaping Use: Former   Substances: Nicotine  Substance Use Topics   Alcohol use: Yes    Comment: occasionally   Drug use: Never        Review of Systems  Constitutional: Negative.   HENT:  Positive for tinnitus.   Eyes: Negative.   Respiratory: Negative.    Cardiovascular: Negative.   Gastrointestinal: Negative.   Genitourinary: Negative.   Skin: Negative.   Neurological: Negative.   Psychiatric/Behavioral: Negative.       Physical Exam Blood pressure (!) 142/92, pulse 96, temperature 98 F (36.7 C), height '5\' 7"'$  (1.702 m), weight 280 lb (127 kg), SpO2 97 %. Last Weight  Most recent update:  08/10/2022 10:52 AM    Weight  127 kg (280 lb)             CONSTITUTIONAL: Well developed, and nourished, appropriately responsive and aware without distress.   EYES: Sclera non-icteric.   EARS, NOSE, MOUTH AND THROAT:  The oropharynx is clear. Oral mucosa is pink and moist.    Hearing is intact to voice.  NECK: Trachea is midline, and there is no jugular venous distension.  LYMPH NODES:  Lymph nodes in the neck are not appreciated. RESPIRATORY:  Lungs are clear, and breath sounds are equal bilaterally.  Normal respiratory effort without pathologic use of accessory muscles. CARDIOVASCULAR: Heart is regular in rate and rhythm.   Well perfused.  GI: The abdomen is well rounded with obvious diastases recti, otherwise soft, nontender, and nondistended.  Is difficult to definitively appreciate an umbilical fascial defect.  There were no palpable masses.  I did not appreciate hepatosplenomegaly.  MUSCULOSKELETAL:  Symmetrical muscle tone appreciated in all four extremities.    SKIN: Skin turgor is normal. No pathologic skin lesions appreciated.  NEUROLOGIC:  Motor and sensation appear grossly normal.  Cranial nerves are grossly without defect. PSYCH:  Alert and oriented to person, place and time. Affect is appropriate for situation.  Data Reviewed I have personally reviewed what is currently available of the patient's imaging, recent labs and medical records.   Labs:  Latest Ref Rng & Units 07/15/2021    9:02 AM  CBC  WBC 3.8 - 10.8 Thousand/uL 11.2   Hemoglobin 13.2 - 17.1 g/dL 17.0   Hematocrit 38.5 - 50.0 % 50.8   Platelets 140 - 400 Thousand/uL 268       Latest Ref Rng & Units 07/15/2021    9:02 AM  CMP  Glucose 65 - 99 mg/dL 260   BUN 7 - 25 mg/dL 9   Creatinine 0.60 - 1.24 mg/dL 0.83   Sodium 135 - 146 mmol/L 138   Potassium 3.5 - 5.3 mmol/L 4.1   Chloride 98 - 110 mmol/L 99   CO2 20 - 32 mmol/L 31   Calcium 8.6 - 10.3 mg/dL 9.0   Total Protein 6.1 - 8.1 g/dL 6.7   Total  Bilirubin 0.2 - 1.2 mg/dL 0.8   AST 10 - 40 U/L 26   ALT 9 - 46 U/L 44       Imaging: Radiological images reviewed:   Within last 24 hrs: No results found.  Assessment    Small umbilical fascial defect, with diastases recti. 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  We also discussed diastases recti and the challenges of repair/plication in the midst of morbid obesity.  I believe he is highly motivated to pursue weight loss, we discussed avoiding high impact types of exercise involving Valsalva maneuvers, straining and core exercises.  At present he is not having any kind of pain associated with these and he may trial them until they become bothersome.  Will be glad to see him back in follow-up in 73-monthintervals to assess for his progress and weight loss and how he is handling the symptoms related to his hernia.  Face-to-face time spent with the patient and accompanying care providers(if present) was 35 minutes, with more than 50% of the time spent counseling, educating, and coordinating care of the patient.    These notes generated with voice recognition software. I apologize for typographical errors.  DRonny BaconM.D., FACS 08/10/2022, 12:37 PM

## 2022-11-01 NOTE — Progress Notes (Unsigned)
Established Patient Office Visit  Subjective:  Patient ID: Brian Bray, male    DOB: 19-Feb-1991  Age: 32 y.o. MRN: [id number]  CC:  No chief complaint on file.   HPI Brian Bray presents for follow up on chronic medical conditions.   Diabetes, Type 2: -Last A1c 11/23 8.0% -Medications: Metformin 1000 mg BID - was given samples of [name] but hasn't taken yet. Cost too expensive.  -Diet: decreased soda and sugar in diet -Eye exam: UTD 7/23 -Foot exam: UTD 5/23 -Microalbumin: UTD 8/23 -Statin: Not currently, tried Lipitor but didn't want to be on it anymore  -Denies symptoms of hypoglycemia, polyuria, polydipsia, numbness extremities, foot ulcers/trauma. Does endorse fatigue, cold distal extremities.   HLD: -Medications: Not on any statins currently.  Was given a prescription for Lipitor, however the patient did not pick this up.  He states he does not want to take medication for cholesterol at this time. -Last lipid panel: Lipid Panel     Component Value Date/Time   CHOL 161 07/15/2021 0902   TRIG 124 07/15/2021 0902   HDL 42 07/15/2021 0902   CHOLHDL 3.8 07/15/2021 0902   LDLCALC 96 07/15/2021 0902    The ASCVD Risk score (Arnett DK, et al., 2019) failed to calculate for the following reasons:   The 2019 ASCVD risk score is only valid for ages 24 to 98  Umbilical Hernia:  -Appears to be getting bigger but denies pain -Had for a year -[place] 3/23 showing small fat containing umbilical hernia   No past medical history on file.  Past Surgical History:  Procedure Laterality Date   TONSILLECTOMY AND ADENOIDECTOMY Bilateral     Family History  Problem Relation Age of Onset   Hypertension Brother     Social History   Socioeconomic History   Marital status: Single    Spouse name: Not on file   Number of children: Not on file   Years of education: Not on file   Highest education level: Some college, no degree  Occupational History   Not on file  Tobacco Use    Smoking status: Former    Types: Cigarettes    Passive exposure: Past   Smokeless tobacco: Current  Vaping Use   Vaping Use: Former   Substances: Nicotine  Substance and Sexual Activity   Alcohol use: Yes    Comment: occasionally   Drug use: Never   Sexual activity: Yes  Other Topics Concern   Not on file  Social History Narrative   Not on file   Social Determinants of Health   Financial Resource Strain: Patient Declined (10/29/2022)   Overall Financial Resource Strain (CARDIA)    Difficulty of Paying Living Expenses: Patient declined  Food Insecurity: No Food Insecurity (10/29/2022)   Hunger Vital Sign    Worried About Running Out of Food in the Last Year: Never true    Ran Out of Food in the Last Year: Never true  Transportation Needs: No Transportation Needs (10/29/2022)   PRAPARE - Administrator, Civil Service (Medical): No    Lack of Transportation (Non-Medical): No  Physical Activity: Unknown (10/29/2022)   Exercise Vital Sign    Days of Exercise per Week: 0 days    Minutes of Exercise per Session: Not on file  Stress: Stress Concern Present (10/29/2022)   [employer] of Occupational Health - Occupational Stress Questionnaire    Feeling of Stress : To some extent  Social Connections: Socially Isolated (10/29/2022)   Social  Connection and Isolation Panel [NHANES]    Frequency of Communication with Friends and Family: More than three times a week    Frequency of Social Gatherings with Friends and Family: Never    Attends Religious Services: Never    Database administrator or Organizations: No    Attends Engineer, structural: Not on file    Marital Status: Never married  Catering manager Violence: Not on file    Outpatient Medications Prior to Visit  Medication Sig Dispense Refill   metFORMIN (GLUCOPHAGE) 1000 MG tablet Take 1 tablet (1,000 mg total) by mouth 2 (two) times daily with a meal. 180 tablet 1   Semaglutide (RYBELSUS) 3 MG TABS  Take 1 tablet (3 mg total) by mouth daily. (Patient not taking: Reported on 08/10/2022) 30 tablet 1   No facility-administered medications prior to visit.    No Known Allergies  ROS Review of Systems  Constitutional:  Negative for chills and fever.  Eyes:  Negative for visual disturbance.  Respiratory:  Negative for cough and shortness of breath.   Cardiovascular:  Negative for chest pain.  Gastrointestinal:  Negative for abdominal pain, constipation, diarrhea, nausea and vomiting.      Objective:    Physical Exam Constitutional:      Appearance: Normal appearance. He is obese.  HENT:     Head: Normocephalic and atraumatic.  Eyes:     Conjunctiva/sclera: Conjunctivae normal.  Cardiovascular:     Rate and Rhythm: Normal rate and regular rhythm.  Pulmonary:     Effort: Pulmonary effort is normal.     Breath sounds: Normal breath sounds.  Abdominal:     Hernia: A hernia is present.  Musculoskeletal:     Right lower leg: No edema.     Left lower leg: No edema.  Skin:    General: Skin is warm and dry.  Neurological:     General: No focal deficit present.     Mental Status: He is alert. Mental status is at baseline.  Psychiatric:        Mood and Affect: Mood normal.        Behavior: Behavior normal.     There were no vitals taken for this visit. Wt Readings from Last 3 Encounters:  08/10/22 280 lb (127 kg)  08/04/22 285 lb 3.2 oz (129.4 kg)  05/04/22 287 lb 8 oz (130.4 kg)     Health Maintenance Due  Topic Date Due   DTaP/Tdap/Td (1 - Tdap) Never done   COVID-19 Vaccine (3 - 2023-24 season) 02/05/2022   Diabetic kidney evaluation - eGFR measurement  07/15/2022   FOOT EXAM  10/28/2022     There are no preventive care reminders to display for this patient.  No results found for: "TSH" Lab Results  Component Value Date   WBC 11.2 (H) 07/15/2021   HGB 17.0 07/15/2021   HCT 50.8 (H) 07/15/2021   MCV 85.7 07/15/2021   PLT 268 07/15/2021   Lab Results   Component Value Date   NA 138 07/15/2021   K 4.1 07/15/2021   CO2 31 07/15/2021   GLUCOSE 260 (H) 07/15/2021   BUN 9 07/15/2021   CREATININE 0.83 07/15/2021   BILITOT 0.8 07/15/2021   AST 26 07/15/2021   ALT 44 07/15/2021   PROT 6.7 07/15/2021   CALCIUM 9.0 07/15/2021   EGFR 122 07/15/2021   Lab Results  Component Value Date   CHOL 161 07/15/2021   Lab Results  Component Value Date  HDL 42 07/15/2021   Lab Results  Component Value Date   LDLCALC 96 07/15/2021   Lab Results  Component Value Date   TRIG 124 07/15/2021   Lab Results  Component Value Date   CHOLHDL 3.8 07/15/2021   Lab Results  Component Value Date   HGBA1C 8.0 (A) 05/04/2022      Assessment & Plan:   1. Type 2 diabetes mellitus with hyperglycemia, without long-term current use of insulin ([facility]): Labs due. Continue Metformin 1000 mg BID, refilled. Marcelline Deist and Jardiance too expensive, will try Rybelsus to get his A1c closer to 7%.   - CBC w/Diff/Platelet - COMPLETE METABOLIC PANEL WITH GFR - HgB N8G - Semaglutide (RYBELSUS) 3 MG TABS; Take 1 tablet (3 mg total) by mouth daily.  Dispense: 30 tablet; Refill: 1 - metFORMIN (GLUCOPHAGE) 1000 MG tablet; Take 1 tablet (1,000 mg total) by mouth 2 (two) times daily with a meal.  Dispense: 180 tablet; Refill: 1  2. Lipid screening: Recheck fasting lipids.   - Lipid Profile  3. Umbilical hernia without obstruction and without gangrene: Referral to surgery placed.   - Ambulatory referral to General Surgery   Follow-up: No follow-ups on file.    Margarita Mail, DO

## 2022-11-02 ENCOUNTER — Encounter: Payer: Self-pay | Admitting: Internal Medicine

## 2022-11-02 ENCOUNTER — Ambulatory Visit: Payer: BC Managed Care – PPO | Admitting: Internal Medicine

## 2022-11-02 ENCOUNTER — Telehealth: Payer: Self-pay

## 2022-11-02 VITALS — BP 122/90 | HR 117 | Temp 98.0°F | Resp 16 | Ht 67.0 in | Wt 287.8 lb

## 2022-11-02 DIAGNOSIS — Z13220 Encounter for screening for lipoid disorders: Secondary | ICD-10-CM | POA: Diagnosis not present

## 2022-11-02 DIAGNOSIS — Z7984 Long term (current) use of oral hypoglycemic drugs: Secondary | ICD-10-CM | POA: Diagnosis not present

## 2022-11-02 DIAGNOSIS — E1165 Type 2 diabetes mellitus with hyperglycemia: Secondary | ICD-10-CM | POA: Diagnosis not present

## 2022-11-02 MED ORDER — TIRZEPATIDE 2.5 MG/0.5ML ~~LOC~~ SOAJ: 2.5000 mg | SUBCUTANEOUS | 0 refills | Status: DC

## 2022-11-02 NOTE — Telephone Encounter (Signed)
Prior auth started through cover my meds on mounjaro on 11/02/22

## 2022-11-03 LAB — CBC WITH DIFFERENTIAL/PLATELET
Absolute Monocytes: 698 cells/uL (ref 200–950)
Basophils Absolute: 109 cells/uL (ref 0–200)
Basophils Relative: 1 %
Eosinophils Absolute: 164 cells/uL (ref 15–500)
Eosinophils Relative: 1.5 %
HCT: 48.8 % (ref 38.5–50.0)
Hemoglobin: 16.3 g/dL (ref 13.2–17.1)
Lymphs Abs: 2823 cells/uL (ref 850–3900)
MCH: 28.2 pg (ref 27.0–33.0)
MCHC: 33.4 g/dL (ref 32.0–36.0)
MCV: 84.4 fL (ref 80.0–100.0)
MPV: 10.9 fL (ref 7.5–12.5)
Monocytes Relative: 6.4 %
Neutro Abs: 7107 cells/uL (ref 1500–7800)
Neutrophils Relative %: 65.2 %
Platelets: 169 10*3/uL (ref 140–400)
RBC: 5.78 10*6/uL (ref 4.20–5.80)
RDW: 12.7 % (ref 11.0–15.0)
Total Lymphocyte: 25.9 %
WBC: 10.9 10*3/uL — ABNORMAL HIGH (ref 3.8–10.8)

## 2022-11-03 LAB — COMPLETE METABOLIC PANEL WITH GFR
AG Ratio: 1.6 (calc) (ref 1.0–2.5)
ALT: 41 U/L (ref 9–46)
AST: 24 U/L (ref 10–40)
Albumin: 4.2 g/dL (ref 3.6–5.1)
Alkaline phosphatase (APISO): 46 U/L (ref 36–130)
BUN: 14 mg/dL (ref 7–25)
CO2: 27 mmol/L (ref 20–32)
Calcium: 9.3 mg/dL (ref 8.6–10.3)
Chloride: 101 mmol/L (ref 98–110)
Creat: 0.85 mg/dL (ref 0.60–1.26)
Globulin: 2.6 g/dL (calc) (ref 1.9–3.7)
Glucose, Bld: 198 mg/dL — ABNORMAL HIGH (ref 65–99)
Potassium: 4.3 mmol/L (ref 3.5–5.3)
Sodium: 140 mmol/L (ref 135–146)
Total Bilirubin: 0.8 mg/dL (ref 0.2–1.2)
Total Protein: 6.8 g/dL (ref 6.1–8.1)
eGFR: 119 mL/min/{1.73_m2} (ref >=60)

## 2022-11-03 LAB — HEMOGLOBIN A1C
Hgb A1c MFr Bld: 9.4 % of total Hgb — ABNORMAL HIGH (ref <5.7)
Mean Plasma Glucose: 223 mg/dL
eAG (mmol/L): 12.4 mmol/L

## 2022-11-03 LAB — LIPID PANEL
Cholesterol: 132 mg/dL (ref <200)
HDL: 41 mg/dL (ref >=40)
LDL Cholesterol (Calc): 72 mg/dL (calc)
Non-HDL Cholesterol (Calc): 91 mg/dL (calc) (ref <130)
Total CHOL/HDL Ratio: 3.2 (calc) (ref <5.0)
Triglycerides: 100 mg/dL (ref <150)

## 2022-11-12 ENCOUNTER — Telehealth: Payer: Self-pay

## 2022-11-12 NOTE — Telephone Encounter (Signed)
PA submitted through cover my meds on moujaro on 11/12/22 waiting for Question request

## 2022-11-12 NOTE — Telephone Encounter (Signed)
When I went in to check cover my meds for response, it states member not found.  May need to call patient and verify insurance?

## 2022-12-05 NOTE — Progress Notes (Deleted)
Established Patient Office Visit  Subjective:  Patient ID: Brian Bray, male    DOB: 11-08-1990  Age: 32 y.o. MRN: [id number]  CC:  No chief complaint on file.   HPI Brian Bray presents for follow up on chronic medical conditions.   Diabetes, Type 2: -Last A1c 11/23 8.0% -Medications: Metformin 1000 mg BID, started on Mounjaro 2.5 mg at LOV ? -Failed Medications: Marcelline Deist too expensive, Rybelsus also too expensive.  -Diet: decreased soda and sugar in diet -Eye exam: UTD 7/23 -Foot exam: Due -Microalbumin: UTD 8/23 -Statin: Not currently, tried Lipitor but didn't want to be on it anymore  -Denies symptoms of hypoglycemia, polyuria, polydipsia, numbness extremities, foot ulcers/trauma. Does endorse fatigue, cold distal extremities.   HLD: -Medications: Not on any statins currently.  Was given a prescription for Lipitor, however the patient did not pick this up.  He states he does not want to take medication for cholesterol at this time. -Last lipid panel: Lipid Panel     Component Value Date/Time   CHOL 132 11/02/2022 1148   TRIG 100 11/02/2022 1148   HDL 41 11/02/2022 1148   CHOLHDL 3.2 11/02/2022 1148   LDLCALC 72 11/02/2022 1148     No past medical history on file.  Past Surgical History:  Procedure Laterality Date   TONSILLECTOMY AND ADENOIDECTOMY Bilateral     Family History  Problem Relation Age of Onset   Hypertension Brother     Social History   Socioeconomic History   Marital status: Single    Spouse name: Not on file   Number of children: Not on file   Years of education: Not on file   Highest education level: Some college, no degree  Occupational History   Not on file  Tobacco Use   Smoking status: Former    Types: Cigarettes    Passive exposure: Past   Smokeless tobacco: Current  Vaping Use   Vaping Use: Former   Substances: Nicotine  Substance and Sexual Activity   Alcohol use: Yes    Comment: occasionally   Drug use: Never   Sexual  activity: Yes  Other Topics Concern   Not on file  Social History Narrative   Not on file   Social Determinants of Health   Financial Resource Strain: Patient Declined (10/29/2022)   Overall Financial Resource Strain (CARDIA)    Difficulty of Paying Living Expenses: Patient declined  Food Insecurity: No Food Insecurity (10/29/2022)   Hunger Vital Sign    Worried About Running Out of Food in the Last Year: Never true    Ran Out of Food in the Last Year: Never true  Transportation Needs: No Transportation Needs (10/29/2022)   PRAPARE - Administrator, Civil Service (Medical): No    Lack of Transportation (Non-Medical): No  Physical Activity: Unknown (10/29/2022)   Exercise Vital Sign    Days of Exercise per Week: 0 days    Minutes of Exercise per Session: Not on file  Stress: Stress Concern Present (10/29/2022)   [employer] of Occupational Health - Occupational Stress Questionnaire    Feeling of Stress : To some extent  Social Connections: Socially Isolated (10/29/2022)   Social Connection and Isolation Panel [NHANES]    Frequency of Communication with Friends and Family: More than three times a week    Frequency of Social Gatherings with Friends and Family: Never    Attends Religious Services: Never    Database administrator or Organizations: No  Attends Banker Meetings: Not on file    Marital Status: Never married  Intimate Partner Violence: Not on file    Outpatient Medications Prior to Visit  Medication Sig Dispense Refill   metFORMIN (GLUCOPHAGE) 1000 MG tablet Take 1 tablet (1,000 mg total) by mouth 2 (two) times daily with a meal. 180 tablet 1   tirzepatide (MOUNJARO) 2.5 MG/0.5ML Pen Inject 2.5 mg into the skin once a week. 2 mL 0   No facility-administered medications prior to visit.    No Known Allergies  ROS Review of Systems  Constitutional:  Negative for chills and fever.  Eyes:  Negative for visual disturbance.  Respiratory:   Negative for cough and shortness of breath.   Cardiovascular:  Negative for chest pain.  Gastrointestinal:  Negative for abdominal pain, constipation, diarrhea, nausea and vomiting.      Objective:    Physical Exam Constitutional:      Appearance: Normal appearance. He is obese.  HENT:     Head: Normocephalic and atraumatic.  Eyes:     Conjunctiva/sclera: Conjunctivae normal.  Cardiovascular:     Rate and Rhythm: Normal rate and regular rhythm.     Pulses:          Dorsalis pedis pulses are 2+ on the right side and 2+ on the left side.  Pulmonary:     Effort: Pulmonary effort is normal.     Breath sounds: Normal breath sounds.  Musculoskeletal:     Right foot: Normal range of motion. No deformity, bunion, Charcot foot, foot drop or prominent metatarsal heads.     Left foot: Normal range of motion. No deformity, bunion, Charcot foot, foot drop or prominent metatarsal heads.  Feet:     Right foot:     Protective Sensation: 6 sites tested.  6 sites sensed.     Skin integrity: Skin integrity normal.     Toenail Condition: Right toenails are normal.     Left foot:     Protective Sensation: 6 sites tested.  6 sites sensed.     Skin integrity: Skin integrity normal.     Toenail Condition: Left toenails are normal.  Skin:    General: Skin is warm and dry.  Neurological:     General: No focal deficit present.     Mental Status: He is alert. Mental status is at baseline.  Psychiatric:        Mood and Affect: Mood normal.        Behavior: Behavior normal.     There were no vitals taken for this visit. Wt Readings from Last 3 Encounters:  11/02/22 287 lb 12.8 oz (130.5 kg)  08/10/22 280 lb (127 kg)  08/04/22 285 lb 3.2 oz (129.4 kg)     Health Maintenance Due  Topic Date Due   COVID-19 Vaccine (3 - 2023-24 season) 02/05/2022     There are no preventive care reminders to display for this patient.  No results found for: "TSH" Lab Results  Component Value Date   WBC  10.9 (H) 11/02/2022   HGB 16.3 11/02/2022   HCT 48.8 11/02/2022   MCV 84.4 11/02/2022   PLT 169 11/02/2022   Lab Results  Component Value Date   NA 140 11/02/2022   K 4.3 11/02/2022   CO2 27 11/02/2022   GLUCOSE 198 (H) 11/02/2022   BUN 14 11/02/2022   CREATININE 0.85 11/02/2022   BILITOT 0.8 11/02/2022   AST 24 11/02/2022   ALT 41 11/02/2022  PROT 6.8 11/02/2022   CALCIUM 9.3 11/02/2022   EGFR 119 11/02/2022   Lab Results  Component Value Date   CHOL 132 11/02/2022   Lab Results  Component Value Date   HDL 41 11/02/2022   Lab Results  Component Value Date   LDLCALC 72 11/02/2022   Lab Results  Component Value Date   TRIG 100 11/02/2022   Lab Results  Component Value Date   CHOLHDL 3.2 11/02/2022   Lab Results  Component Value Date   HGBA1C 9.4 (H) 11/02/2022      Assessment & Plan:   1. Type 2 diabetes mellitus with hyperglycemia, without long-term current use of insulin (HCC): Labs due, will recheck A1c. Could not afford [name] or Rybelsus, currently only on Metformin 1000 mg BID. Will try Mounjaro, 2.5 mg dose sent to pharmacy. Follow up here in 1 month to recheck medication. If still too expensive will refer to pharmacy. Foot exam today.   - tirzepatide [facility]) 2.5 MG/0.5ML Pen; Inject 2.5 mg into the skin once a week.  Dispense: 2 mL; Refill: 0 - CBC w/Diff/Platelet - COMPLETE METABOLIC PANEL WITH GFR - HgB G9F - [facility] Diabetes Foot Exam  2. Lipid screening: Fasting lipid panel ordered.   - Lipid Profile    Follow-up: No follow-ups on file.    Margarita Mail, DO

## 2022-12-06 ENCOUNTER — Ambulatory Visit: Payer: BC Managed Care – PPO | Admitting: Internal Medicine

## 2022-12-16 ENCOUNTER — Encounter: Payer: Self-pay | Admitting: Internal Medicine

## 2022-12-22 NOTE — Progress Notes (Signed)
Established Patient Office Visit  Subjective:  Patient ID: Brian Bray, male    DOB: [birth year]  Age: 32 y.o. MRN: [id number]  CC:  Chief Complaint  Patient presents with   Follow-up   Diabetes    HPI Brian Bray presents for follow up on chronic medical conditions.   Diabetes, Type 2: -Last A1c 5/24 9.4% -Medications: Metformin 1000 mg BID, started on Mounjaro 2.5 mg at LOV, had some nausea and abdominal cramping after the first dose but this resolved. Also woke up feeling lightheaded once but this resolved as well, sugar that day was 200 -Failed Medications: Marcelline Deist too expensive, Rybelsus also too expensive.  -Diet: decreased soda and sugar in diet, appetite decreased -Eye exam: UTD 7/23 -Foot exam: UTD 5/24 -Microalbumin: UTD 8/23 -Statin: Not currently, tried Lipitor but didn't want to be on it anymore  -Denies symptoms of hypoglycemia, polyuria, polydipsia, numbness extremities, foot ulcers/trauma. Does endorse fatigue, cold distal extremities.   HLD: -Medications: Not on any statins currently.  Was given a prescription for Lipitor, however the patient did not pick this up.  He states he does not want to take medication for cholesterol at this time. -Last lipid panel: Lipid Panel     Component Value Date/Time   CHOL 132 11/02/2022 1148   TRIG 100 11/02/2022 1148   HDL 41 11/02/2022 1148   CHOLHDL 3.2 11/02/2022 1148   LDLCALC 72 11/02/2022 1148     History reviewed. No pertinent past medical history.  Past Surgical History:  Procedure Laterality Date   TONSILLECTOMY AND ADENOIDECTOMY Bilateral     Family History  Problem Relation Age of Onset   Hypertension Brother     Social History   Socioeconomic History   Marital status: Single    Spouse name: Not on file   Number of children: Not on file   Years of education: Not on file   Highest education level: Some college, no degree  Occupational History   Not on file  Tobacco Use   Smoking status: Former     Types: Cigarettes    Passive exposure: Past   Smokeless tobacco: Current  Vaping Use   Vaping status: Former   Substances: Nicotine  Substance and Sexual Activity   Alcohol use: Yes    Comment: occasionally   Drug use: Never   Sexual activity: Yes  Other Topics Concern   Not on file  Social History Narrative   Not on file   Social Determinants of Health   Financial Resource Strain: Patient Declined (10/29/2022)   Overall Financial Resource Strain (CARDIA)    Difficulty of Paying Living Expenses: Patient declined  Food Insecurity: No Food Insecurity (10/29/2022)   Hunger Vital Sign    Worried About Running Out of Food in the Last Year: Never true    Ran Out of Food in the Last Year: Never true  Transportation Needs: No Transportation Needs (10/29/2022)   PRAPARE - Administrator, Civil Service (Medical): No    Lack of Transportation (Non-Medical): No  Physical Activity: Unknown (10/29/2022)   Exercise Vital Sign    Days of Exercise per Week: 0 days    Minutes of Exercise per Session: Not on file  Stress: Stress Concern Present (10/29/2022)   [employer] of Occupational Health - Occupational Stress Questionnaire    Feeling of Stress : To some extent  Social Connections: Socially Isolated (10/29/2022)   Social Connection and Isolation Panel [NHANES]    Frequency of Communication with Friends  and Family: More than three times a week    Frequency of Social Gatherings with Friends and Family: Never    Attends Religious Services: Never    Database administrator or Organizations: No    Attends Engineer, structural: Not on file    Marital Status: Never married  Intimate Partner Violence: Not on file    Outpatient Medications Prior to Visit  Medication Sig Dispense Refill   metFORMIN (GLUCOPHAGE) 1000 MG tablet Take 1 tablet (1,000 mg total) by mouth 2 (two) times daily with a meal. 180 tablet 1   tirzepatide (MOUNJARO) 2.5 MG/0.5ML Pen Inject 2.5 mg  into the skin once a week. 2 mL 0   No facility-administered medications prior to visit.    No Known Allergies  ROS Review of Systems  Constitutional:  Negative for chills and fever.  Eyes:  Negative for visual disturbance.  Respiratory:  Negative for cough and shortness of breath.   Cardiovascular:  Negative for chest pain.  Gastrointestinal:  Negative for abdominal pain, constipation, diarrhea, nausea and vomiting.      Objective:    Physical Exam Constitutional:      Appearance: Normal appearance.  HENT:     Head: Normocephalic and atraumatic.  Eyes:     Conjunctiva/sclera: Conjunctivae normal.  Cardiovascular:     Rate and Rhythm: Normal rate and regular rhythm.  Pulmonary:     Effort: Pulmonary effort is normal.     Breath sounds: Normal breath sounds.  Skin:    General: Skin is warm and dry.  Neurological:     General: No focal deficit present.     Mental Status: He is alert. Mental status is at baseline.  Psychiatric:        Mood and Affect: Mood normal.        Behavior: Behavior normal.     Resp 18   Ht 5\' 7"  (1.702 m)   BMI 45.08 kg/m  Wt Readings from Last 3 Encounters:  11/02/22 287 lb 12.8 oz (130.5 kg)  08/10/22 280 lb (127 kg)  08/04/22 285 lb 3.2 oz (129.4 kg)     Health Maintenance Due  Topic Date Due   OPHTHALMOLOGY EXAM  12/24/2022     There are no preventive care reminders to display for this patient.  No results found for: "TSH" Lab Results  Component Value Date   WBC 10.9 (H) 11/02/2022   HGB 16.3 11/02/2022   HCT 48.8 11/02/2022   MCV 84.4 11/02/2022   PLT 169 11/02/2022   Lab Results  Component Value Date   NA 140 11/02/2022   K 4.3 11/02/2022   CO2 27 11/02/2022   GLUCOSE 198 (H) 11/02/2022   BUN 14 11/02/2022   CREATININE 0.85 11/02/2022   BILITOT 0.8 11/02/2022   AST 24 11/02/2022   ALT 41 11/02/2022   PROT 6.8 11/02/2022   CALCIUM 9.3 11/02/2022   EGFR 119 11/02/2022   Lab Results  Component Value Date    CHOL 132 11/02/2022   Lab Results  Component Value Date   HDL 41 11/02/2022   Lab Results  Component Value Date   LDLCALC 72 11/02/2022   Lab Results  Component Value Date   TRIG 100 11/02/2022   Lab Results  Component Value Date   CHOLHDL 3.2 11/02/2022   Lab Results  Component Value Date   HGBA1C 9.4 (H) 11/02/2022      Assessment & Plan:   1. Type 2 diabetes mellitus with hyperglycemia,  without long-term current use of insulin (HCC)/Nausea: A1c improved at 8.2%, doing well with the [facility]. Increase to 5 mg weekly. Will prescribe Zofran to use as needed if nauseous. Continue Metformin. Follow up in 3 months.   - POCT HgB A1C - tirzepatide (MOUNJARO) 5 MG/0.5ML Pen; Inject 5 mg into the skin once a week.  Dispense: 6 mL; Refill: 0 - ondansetron (ZOFRAN) 4 MG tablet; Take 1 tablet (4 mg total) by mouth every 8 (eight) hours as needed for nausea or vomiting.  Dispense: 20 tablet; Refill: 0   Follow-up: Return in about 3 months (around 03/26/2023).    Margarita Mail, DO

## 2022-12-24 ENCOUNTER — Encounter: Payer: Self-pay | Admitting: Internal Medicine

## 2022-12-24 ENCOUNTER — Ambulatory Visit (INDEPENDENT_AMBULATORY_CARE_PROVIDER_SITE_OTHER): Payer: BC Managed Care – PPO | Admitting: Internal Medicine

## 2022-12-24 VITALS — BP 132/80 | HR 102 | Temp 98.4°F | Resp 18 | Ht 67.0 in | Wt 285.3 lb

## 2022-12-24 DIAGNOSIS — R110 Nausea: Secondary | ICD-10-CM

## 2022-12-24 DIAGNOSIS — E1165 Type 2 diabetes mellitus with hyperglycemia: Secondary | ICD-10-CM | POA: Diagnosis not present

## 2022-12-24 LAB — POCT GLYCOSYLATED HEMOGLOBIN (HGB A1C): Hemoglobin A1C: 8.2 % — AB (ref 4.0–5.6)

## 2022-12-24 MED ORDER — TIRZEPATIDE 5 MG/0.5ML ~~LOC~~ SOAJ
5.0000 mg | SUBCUTANEOUS | 0 refills | Status: DC
Start: 2022-12-24 — End: 2023-03-25

## 2022-12-24 MED ORDER — ONDANSETRON HCL 4 MG PO TABS: 4.0000 mg | ORAL_TABLET | Freq: Three times a day (TID) | ORAL | 0 refills | Status: DC | PRN

## 2023-01-03 ENCOUNTER — Encounter: Payer: Self-pay | Admitting: Internal Medicine

## 2023-02-16 ENCOUNTER — Encounter: Payer: Self-pay | Admitting: Internal Medicine

## 2023-02-16 DIAGNOSIS — E1165 Type 2 diabetes mellitus with hyperglycemia: Secondary | ICD-10-CM

## 2023-02-16 MED ORDER — METFORMIN HCL 1000 MG PO TABS: 1000.0000 mg | ORAL_TABLET | Freq: Two times a day (BID) | ORAL | 1 refills | Status: DC

## 2023-03-24 NOTE — Progress Notes (Signed)
Established Patient Office Visit  Subjective:  Patient ID: Brian Bray, male    DOB: [birth year]  Age: 32 y.o. MRN: [id number]  CC:  Chief Complaint  Patient presents with   Follow-up   Diabetes    HPI Brian Bray presents for follow up on chronic medical conditions.   Diabetes, Type 2: -Last A1c 7/24 8.2% -Medications: Metformin 1000 mg BID, increased Mounjaro to 5 mg at LOV.  Overall still tolerating well although he has noticed some increase in acid reflux symptoms.  He does take over-the-counter medications as needed. -Failed Medications: Marcelline Deist too expensive, Rybelsus also too expensive.  -Diet: decreased soda and sugar in diet, appetite decreased -Eye exam: Due -will call to schedule -Foot exam: UTD 5/24 -Microalbumin: Due  -Statin: Not currently, tried Lipitor but didn't want to be on it anymore  -Denies symptoms of hypoglycemia, polyuria, polydipsia, numbness extremities, foot ulcers/trauma. Does endorse fatigue, cold distal extremities.   HLD: -Medications: Not on any statins currently.  Was given a prescription for Lipitor, however the patient did not pick this up.  He states he does not want to take medication for cholesterol at this time. -Last lipid panel: Lipid Panel     Component Value Date/Time   CHOL 132 11/02/2022 1148   TRIG 100 11/02/2022 1148   HDL 41 11/02/2022 1148   CHOLHDL 3.2 11/02/2022 1148   LDLCALC 72 11/02/2022 1148     No past medical history on file.  Past Surgical History:  Procedure Laterality Date   TONSILLECTOMY AND ADENOIDECTOMY Bilateral     Family History  Problem Relation Age of Onset   Hypertension Brother     Social History   Socioeconomic History   Marital status: Single    Spouse name: Not on file   Number of children: Not on file   Years of education: Not on file   Highest education level: Some college, no degree  Occupational History   Not on file  Tobacco Use   Smoking status: Former    Types: Cigarettes     Passive exposure: Past   Smokeless tobacco: Current  Vaping Use   Vaping status: Former   Substances: Nicotine  Substance and Sexual Activity   Alcohol use: Yes    Comment: occasionally   Drug use: Never   Sexual activity: Yes  Other Topics Concern   Not on file  Social History Narrative   Not on file   Social Determinants of Health   Financial Resource Strain: Low Risk  (03/21/2023)   Overall Financial Resource Strain (CARDIA)    Difficulty of Paying Living Expenses: Not very hard  Food Insecurity: No Food Insecurity (03/21/2023)   Hunger Vital Sign    Worried About Running Out of Food in the Last Year: Never true    Ran Out of Food in the Last Year: Never true  Transportation Needs: No Transportation Needs (03/21/2023)   PRAPARE - Administrator, Civil Service (Medical): No    Lack of Transportation (Non-Medical): No  Physical Activity: Unknown (03/21/2023)   Exercise Vital Sign    Days of Exercise per Week: 0 days    Minutes of Exercise per Session: Not on file  Stress: Stress Concern Present (03/21/2023)   [employer] of Occupational Health - Occupational Stress Questionnaire    Feeling of Stress : To some extent  Social Connections: Socially Isolated (03/21/2023)   Social Connection and Isolation Panel [NHANES]    Frequency of Communication with Friends and Family:  Once a week    Frequency of Social Gatherings with Friends and Family: Once a week    Attends Religious Services: Never    Database administrator or Organizations: No    Attends Engineer, structural: Not on file    Marital Status: Never married  Intimate Partner Violence: Not on file    Outpatient Medications Prior to Visit  Medication Sig Dispense Refill   metFORMIN (GLUCOPHAGE) 1000 MG tablet Take 1 tablet (1,000 mg total) by mouth 2 (two) times daily with a meal. 180 tablet 1   ondansetron (ZOFRAN) 4 MG tablet Take 1 tablet (4 mg total) by mouth every 8 (eight) hours as  needed for nausea or vomiting. 20 tablet 0   tirzepatide (MOUNJARO) 5 MG/0.5ML Pen Inject 5 mg into the skin once a week. 6 mL 0   No facility-administered medications prior to visit.    No Known Allergies  ROS Review of Systems  Constitutional:  Negative for chills and fever.  Eyes:  Negative for visual disturbance.  Respiratory:  Negative for cough and shortness of breath.   Cardiovascular:  Negative for chest pain.  Gastrointestinal:  Negative for abdominal pain, constipation, diarrhea, nausea and vomiting.      Objective:    Physical Exam Constitutional:      Appearance: Normal appearance.  HENT:     Head: Normocephalic and atraumatic.  Eyes:     Conjunctiva/sclera: Conjunctivae normal.  Cardiovascular:     Rate and Rhythm: Normal rate and regular rhythm.  Pulmonary:     Effort: Pulmonary effort is normal.     Breath sounds: Normal breath sounds.  Skin:    General: Skin is warm and dry.  Neurological:     General: No focal deficit present.     Mental Status: He is alert. Mental status is at baseline.  Psychiatric:        Mood and Affect: Mood normal.        Behavior: Behavior normal.     There were no vitals taken for this visit. Wt Readings from Last 3 Encounters:  12/24/22 285 lb 4.8 oz (129.4 kg)  11/02/22 287 lb 12.8 oz (130.5 kg)  08/10/22 280 lb (127 kg)     Health Maintenance Due  Topic Date Due   OPHTHALMOLOGY EXAM  12/24/2022   INFLUENZA VACCINE  Never done   Diabetic kidney evaluation - Urine ACR  01/28/2023   COVID-19 Vaccine (3 - 2023-24 season) 02/06/2023     There are no preventive care reminders to display for this patient.  No results found for: "TSH" Lab Results  Component Value Date   WBC 10.9 (H) 11/02/2022   HGB 16.3 11/02/2022   HCT 48.8 11/02/2022   MCV 84.4 11/02/2022   PLT 169 11/02/2022   Lab Results  Component Value Date   NA 140 11/02/2022   K 4.3 11/02/2022   CO2 27 11/02/2022   GLUCOSE 198 (H) 11/02/2022    BUN 14 11/02/2022   CREATININE 0.85 11/02/2022   BILITOT 0.8 11/02/2022   AST 24 11/02/2022   ALT 41 11/02/2022   PROT 6.8 11/02/2022   CALCIUM 9.3 11/02/2022   EGFR 119 11/02/2022   Lab Results  Component Value Date   CHOL 132 11/02/2022   Lab Results  Component Value Date   HDL 41 11/02/2022   Lab Results  Component Value Date   LDLCALC 72 11/02/2022   Lab Results  Component Value Date   TRIG 100 11/02/2022  Lab Results  Component Value Date   CHOLHDL 3.2 11/02/2022   Lab Results  Component Value Date   HGBA1C 8.2 (A) 12/24/2022      Assessment & Plan:   1. Type 2 diabetes mellitus with hyperglycemia, without long-term current use of insulin (HCC): A1c improved to 6.2% today.  Will continue Mounjaro 5 mg weekly and metformin at 1000 mg twice daily.  Microalbumin ordered today.  Follow-up in 3 months.  - POCT HgB A1C - Urine Microalbumin w/creat. ratio - tirzepatide (MOUNJARO) 5 MG/0.5ML Pen; Inject 5 mg into the skin once a week.  Dispense: 6 mL; Refill: 1  2. Need for influenza vaccination: Flu vaccine administered today.   - Flu vaccine trivalent PF, 6mos and older(Flulaval,Afluria,Fluarix,Fluzone)   Follow-up: Return in about 3 months (around 06/25/2023).    Margarita Mail, DO

## 2023-03-25 ENCOUNTER — Ambulatory Visit: Payer: BC Managed Care – PPO | Admitting: Internal Medicine

## 2023-03-25 ENCOUNTER — Encounter: Payer: Self-pay | Admitting: Internal Medicine

## 2023-03-25 VITALS — BP 118/80 | HR 102 | Temp 98.1°F | Resp 18 | Ht 67.0 in | Wt 269.0 lb

## 2023-03-25 DIAGNOSIS — E1165 Type 2 diabetes mellitus with hyperglycemia: Secondary | ICD-10-CM | POA: Diagnosis not present

## 2023-03-25 DIAGNOSIS — Z7984 Long term (current) use of oral hypoglycemic drugs: Secondary | ICD-10-CM

## 2023-03-25 DIAGNOSIS — Z23 Encounter for immunization: Secondary | ICD-10-CM | POA: Diagnosis not present

## 2023-03-25 LAB — POCT GLYCOSYLATED HEMOGLOBIN (HGB A1C): Hemoglobin A1C: 6.2 % — AB (ref 4.0–5.6)

## 2023-03-25 MED ORDER — TIRZEPATIDE 5 MG/0.5ML ~~LOC~~ SOAJ
5.0000 mg | SUBCUTANEOUS | 1 refills | Status: DC
Start: 2023-03-25 — End: 2023-04-18

## 2023-03-26 LAB — MICROALBUMIN / CREATININE URINE RATIO
Creatinine, Urine: 198 mg/dL (ref 20–320)
Microalb Creat Ratio: 3 mg/g{creat} (ref <30)
Microalb, Ur: 0.6 mg/dL

## 2023-04-16 ENCOUNTER — Encounter: Payer: Self-pay | Admitting: Internal Medicine

## 2023-04-18 ENCOUNTER — Other Ambulatory Visit: Payer: Self-pay | Admitting: Internal Medicine

## 2023-04-18 DIAGNOSIS — E1165 Type 2 diabetes mellitus with hyperglycemia: Secondary | ICD-10-CM

## 2023-04-18 MED ORDER — TIRZEPATIDE 5 MG/0.5ML ~~LOC~~ SOAJ: 5.0000 mg | SUBCUTANEOUS | 1 refills | Status: DC

## 2023-06-27 ENCOUNTER — Ambulatory Visit: Payer: BC Managed Care – PPO | Admitting: Physician Assistant

## 2023-06-27 ENCOUNTER — Encounter: Payer: Self-pay | Admitting: Physician Assistant

## 2023-06-27 VITALS — BP 120/74 | HR 86 | Resp 16 | Ht 67.0 in | Wt 251.0 lb

## 2023-06-27 DIAGNOSIS — E1165 Type 2 diabetes mellitus with hyperglycemia: Secondary | ICD-10-CM | POA: Diagnosis not present

## 2023-06-27 DIAGNOSIS — Z136 Encounter for screening for cardiovascular disorders: Secondary | ICD-10-CM | POA: Diagnosis not present

## 2023-06-27 DIAGNOSIS — Z7984 Long term (current) use of oral hypoglycemic drugs: Secondary | ICD-10-CM

## 2023-06-27 DIAGNOSIS — Z6839 Body mass index (BMI) 39.0-39.9, adult: Secondary | ICD-10-CM | POA: Diagnosis not present

## 2023-06-27 NOTE — Assessment & Plan Note (Signed)
Chronic, ongoing, improving with addition of Mounjaro for DM Patient has lost about 30 lbs in the last 6 months Recommend increasing fiber and protein to assist with weight goals, reduce muscle loss, and aid with GI symptoms Recommend exercising regularly with cardio and resistance training to aid with further weight loss and overall health.  Continue current medications as directed. Follow up in 3 months or sooner if concerns arise

## 2023-06-27 NOTE — Assessment & Plan Note (Signed)
Chronic, historic condition Appears to be better controlled at this time. Most recent A1c was 6.2%. recheck today. Results to dictate further management  Currently taking Mounjaro 5 mg PO every day and Metformin 1000 mg PO BID  He reports GI side effects- predominantly nausea, intermittent vomiting and daily diarrhea Reviewed taking Mounjaro at night, eating regularly and eating prior to injection, injecting in outer thighs to potentially assist with nausea and vomiting. He declines antinausea medications today Reviewed that if A1c and diabetes continue to be well controlled he may be able to reduce Metformin if PCP is amenable to aid with diarrhea symptoms Reviewed importance of annual eye exam for screening Follow up in 3 months or sooner if concerns arise

## 2023-06-27 NOTE — Progress Notes (Signed)
Established Patient Office Visit  Name: Brian Bray   MRN: [id number]    DOB: 05-24-1991   Date:06/27/2023  Today's Provider: Jacquelin Hawking, MHS, PA-C Introduced myself to the patient as a PA-C and provided education on APPs in clinical practice.         Subjective  Chief Complaint  Chief Complaint  Patient presents with   Medical Management of Chronic Issues    3 months    HPI   Diabetes, Type 2 - Last A1c 6.2% - Medications: Metformin, Mounjaro  - Compliance: Good  - Checking BG at home: he does have one at home but usually does not check glucose unless he is having symptoms of a low - Diet: Overall normal diet - has been eating lower portions. Has been trying to reduce excess sugar intake  - Exercise: he is not engaged in regular exercise  - Eye exam: Not UTD- reviewed importance of annual eye exam  - Foot exam: UTD - Microalbumin: UTD - Statin: Not on statin  - PNA vaccine: NA He reports having a few instances of feeling like his blood sugar has dropped. Reports that when he checks glucose, levels are usually over 100 though   Reports he has been having some issues with indigestion and vomiting.  States he has had recurrent incidents with reflux since starting Moujaro  He has been taking OTC medications - Omeprazole and Famotidine to assist with reflux  Unsure if there are trigger foods other than very greasy foods.  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   Type 2 diabetes mellitus with hyperglycemia, without long-term current use of insulin (HCC)  07/29/2021    Past Surgical History:  Procedure Laterality Date   TONSILLECTOMY AND ADENOIDECTOMY Bilateral     Family History  Problem Relation Age of Onset   Hypertension Brother     Social History   Tobacco Use   Smoking status: Former    Types: Cigarettes    Passive exposure: Past   Smokeless tobacco: Current  Substance Use Topics   Alcohol use: Yes    Comment: occasionally     Current Outpatient Medications:    metFORMIN (GLUCOPHAGE) 1000 MG tablet, Take 1 tablet (1,000 mg total) by mouth 2 (two) times daily with a meal., Disp: 180 tablet, Rfl: 1   tirzepatide (MOUNJARO) 5 MG/0.5ML Pen, Inject 5 mg into the skin once a week., Disp: 6 mL, Rfl: 1  No Known Allergies  I personally reviewed active problem list, medication list, allergies, health maintenance, notes from last encounter, lab results with the patient/caregiver today.   Review of Systems  Respiratory:  Negative for shortness of breath and wheezing.   Cardiovascular:  Negative for chest pain, palpitations and leg swelling.  Gastrointestinal:  Positive for diarrhea (constant and ongoing), heartburn and nausea. Negative for constipation.  Neurological:  Negative for dizziness and headaches.      Objective  Vitals:   06/27/23 0926  BP: 120/74  Pulse: 86  Resp: 16  SpO2: 99%  Weight: 251 lb (113.9 kg)  Height: 5\' 7"  (  1.702 m)    Body mass index is 39.31 kg/m.  Physical Exam Vitals reviewed.  Constitutional:      General: He is awake.     Appearance: Normal appearance. He is well-developed and well-groomed.  HENT:     Head: Normocephalic and atraumatic.  Cardiovascular:     Rate and Rhythm: Normal rate and regular rhythm.     Pulses: Normal pulses.          Radial pulses are 2+ on the right side and 2+ on the left side.     Heart sounds: Normal heart sounds. No murmur heard.    No friction rub. No gallop.  Pulmonary:     Effort: Pulmonary effort is normal.     Breath sounds: Normal  breath sounds. No decreased air movement. No decreased breath sounds, wheezing, rhonchi or rales.  Musculoskeletal:     Cervical back: Normal range of motion.     Right lower leg: No edema.     Left lower leg: No edema.  Neurological:     General: No focal deficit present.     Mental Status: He is alert and oriented to person, place, and time. Mental status is at baseline.     GCS: GCS eye subscore is 4. GCS verbal subscore is 5. GCS motor subscore is 6.  Psychiatric:        Attention and Perception: Attention and perception normal.        Mood and Affect: Mood and affect normal.        Speech: Speech normal.        Behavior: Behavior normal. Behavior is cooperative.        Thought Content: Thought content normal.        Cognition and Memory: Cognition normal.      No results found for this or any previous visit (from the past 2160 hours).   PHQ2/9:    03/25/2023    9:55 AM 12/24/2022    1:15 PM 11/02/2022   11:10 AM 08/04/2022   10:54 AM 05/04/2022   11:03 AM  Depression screen PHQ 2/9  Decreased Interest 1 0 0 0 1  Down, Depressed, Hopeless 1 0 0 0 1  PHQ - 2 Score 2 0 0 0 2  Altered sleeping 1 0 0 0 0  Tired, decreased energy 1 0 0 0 0  Change in appetite 0 0 0 0 0  Feeling bad or failure about yourself  0 0 0 0 0  Trouble concentrating 0 0 0 0 0  Moving slowly or fidgety/restless 0 0 0 0 0  Suicidal thoughts 0 0 0 0 0  PHQ-9 Score 4 0 0 0 2  Difficult doing work/chores Not difficult at all Not difficult at all Not difficult at all Not difficult at all Not difficult at all      Fall Risk:    03/25/2023    9:55 AM 12/24/2022    1:15 PM 11/02/2022   11:10 AM 08/04/2022   10:52 AM 05/04/2022   10:58 AM  Fall Risk   Falls in the past year? 0 0 0 0 0  Number falls in past yr: 0 0 0 0 0  Injury with Fall? 0 0 0 0 0      Functional Status Survey:      Assessment & Plan  Problem List Items Addressed This Visit       Endocrine   Type 2 diabetes mellitus  with hyperglycemia, without long-term current  use of insulin (HCC) - Primary   Chronic, historic condition Appears to be better controlled at this time. Most recent A1c was 6.2%. recheck today. Results to dictate further management  Currently taking Mounjaro 5 mg PO every day and Metformin 1000 mg PO BID  He reports GI side effects- predominantly nausea, intermittent vomiting and daily diarrhea Reviewed taking Mounjaro at night, eating regularly and eating prior to injection, injecting in outer thighs to potentially assist with nausea and vomiting. He declines antinausea medications today Reviewed that if A1c and diabetes continue to be well controlled he may be able to reduce Metformin if PCP is amenable to aid with diarrhea symptoms Reviewed importance of annual eye exam for screening Follow up in 3 months or sooner if concerns arise        Relevant Orders   HgB A1c     Other   Severe obesity (BMI 35.0-39.9) with comorbidity (HCC)   Chronic, ongoing, improving with addition of Mounjaro for DM Patient has lost about 30 lbs in the last 6 months Recommend increasing fiber and protein to assist with weight goals, reduce muscle loss, and aid with GI symptoms Recommend exercising regularly with cardio and resistance training to aid with further weight loss and overall health.  Continue current medications as directed. Follow up in 3 months or sooner if concerns arise        Relevant Orders   COMPLETE METABOLIC PANEL WITH GFR   CBC w/Diff/Platelet   Other Visit Diagnoses       Screening for ischemic heart disease       Relevant Orders   Lipid Profile        Return in about 3 months (around 09/25/2023) for DM.   I, Rich Paprocki E Cashtyn Pouliot, PA-C, have reviewed all documentation for this visit. The documentation on 06/27/23 for the exam, diagnosis, procedures, and orders are all accurate and complete.   Jacquelin Hawking, MHS, PA-C [facility]

## 2023-06-28 ENCOUNTER — Encounter: Payer: Self-pay | Admitting: Physician Assistant

## 2023-06-28 LAB — CBC WITH DIFFERENTIAL/PLATELET
Absolute Lymphocytes: 2047 {cells}/uL (ref 850–3900)
Absolute Monocytes: 928 {cells}/uL (ref 200–950)
Basophils Absolute: 83 {cells}/uL (ref 0–200)
Basophils Relative: 0.7 %
Eosinophils Absolute: 143 {cells}/uL (ref 15–500)
Eosinophils Relative: 1.2 %
HCT: 48.8 % (ref 38.5–50.0)
Hemoglobin: 16.1 g/dL (ref 13.2–17.1)
MCH: 28.5 pg (ref 27.0–33.0)
MCHC: 33 g/dL (ref 32.0–36.0)
MCV: 86.5 fL (ref 80.0–100.0)
MPV: 11.2 fL (ref 7.5–12.5)
Monocytes Relative: 7.8 %
Neutro Abs: 8699 {cells}/uL — ABNORMAL HIGH (ref 1500–7800)
Neutrophils Relative %: 73.1 %
Platelets: 286 10*3/uL (ref 140–400)
RBC: 5.64 10*6/uL (ref 4.20–5.80)
RDW: 12.6 % (ref 11.0–15.0)
Total Lymphocyte: 17.2 %
WBC: 11.9 10*3/uL — ABNORMAL HIGH (ref 3.8–10.8)

## 2023-06-28 LAB — HEMOGLOBIN A1C
Hgb A1c MFr Bld: 6.2 %{Hb} — ABNORMAL HIGH (ref <5.7)
Mean Plasma Glucose: 131 mg/dL
eAG (mmol/L): 7.3 mmol/L

## 2023-06-28 LAB — COMPLETE METABOLIC PANEL WITH GFR
AG Ratio: 1.8 (calc) (ref 1.0–2.5)
ALT: 30 U/L (ref 9–46)
AST: 17 U/L (ref 10–40)
Albumin: 4.4 g/dL (ref 3.6–5.1)
Alkaline phosphatase (APISO): 40 U/L (ref 36–130)
BUN: 9 mg/dL (ref 7–25)
CO2: 30 mmol/L (ref 20–32)
Calcium: 9.5 mg/dL (ref 8.6–10.3)
Chloride: 101 mmol/L (ref 98–110)
Creat: 0.87 mg/dL (ref 0.60–1.26)
Globulin: 2.4 g/dL (ref 1.9–3.7)
Glucose, Bld: 123 mg/dL — ABNORMAL HIGH (ref 65–99)
Potassium: 4.3 mmol/L (ref 3.5–5.3)
Sodium: 138 mmol/L (ref 135–146)
Total Bilirubin: 0.9 mg/dL (ref 0.2–1.2)
Total Protein: 6.8 g/dL (ref 6.1–8.1)
eGFR: 118 mL/min/{1.73_m2} (ref >=60)

## 2023-06-28 LAB — LIPID PANEL
Cholesterol: 113 mg/dL (ref <200)
HDL: 38 mg/dL — ABNORMAL LOW (ref >=40)
LDL Cholesterol (Calc): 55 mg/dL
Non-HDL Cholesterol (Calc): 75 mg/dL (ref <130)
Total CHOL/HDL Ratio: 3 (calc) (ref <5.0)
Triglycerides: 110 mg/dL (ref <150)

## 2023-06-28 NOTE — Progress Notes (Signed)
Your labs are back  Your Cholesterol looks great.  Your A1c was 6.2% which is well within goal. Please continue your current regimen. Ask your PCP about making some changes to your Metformin if desired.  Your electrolytes, liver and kidney function were overall normal at this time Your CBC was overall normal with the exception of your white blood cells. These were mildly elevated but I suspect this is because of your cold that you mentioned at your apt.  Please let us know if you have further questions or concerns.

## 2023-08-01 ENCOUNTER — Other Ambulatory Visit: Payer: Self-pay | Admitting: Internal Medicine

## 2023-08-01 ENCOUNTER — Encounter: Payer: Self-pay | Admitting: Internal Medicine

## 2023-08-01 DIAGNOSIS — E1165 Type 2 diabetes mellitus with hyperglycemia: Secondary | ICD-10-CM

## 2023-08-01 MED ORDER — TIRZEPATIDE 5 MG/0.5ML ~~LOC~~ SOAJ: 5.0000 mg | SUBCUTANEOUS | 1 refills | Status: DC

## 2023-08-03 ENCOUNTER — Encounter: Payer: Self-pay | Admitting: Physician Assistant

## 2023-08-09 IMAGING — US US ABDOMEN LIMITED
1 series · 14 of 17 positions shown · non-contrast
Comparison: None.

CLINICAL DATA: Possible abdominal wall hernia

EXAM:
ULTRASOUND ABDOMEN LIMITED

[Series 1: us abdomen limited · 17 acquisitions, 14 frames shown]
[im 1/17]
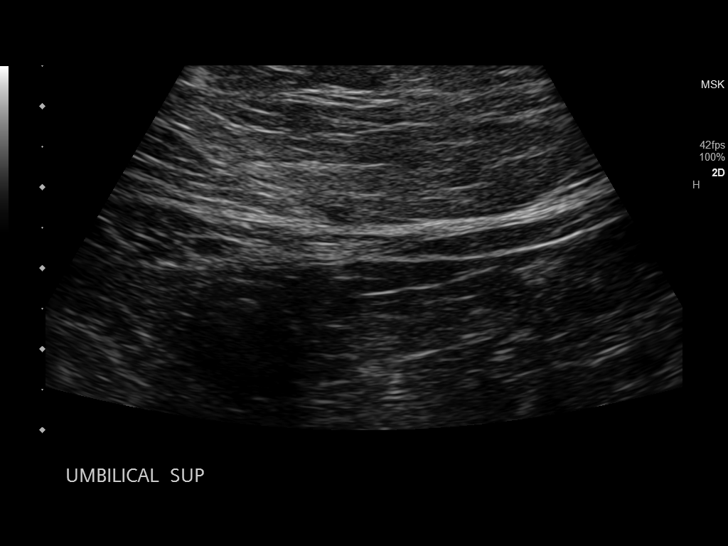
[im 2/17]
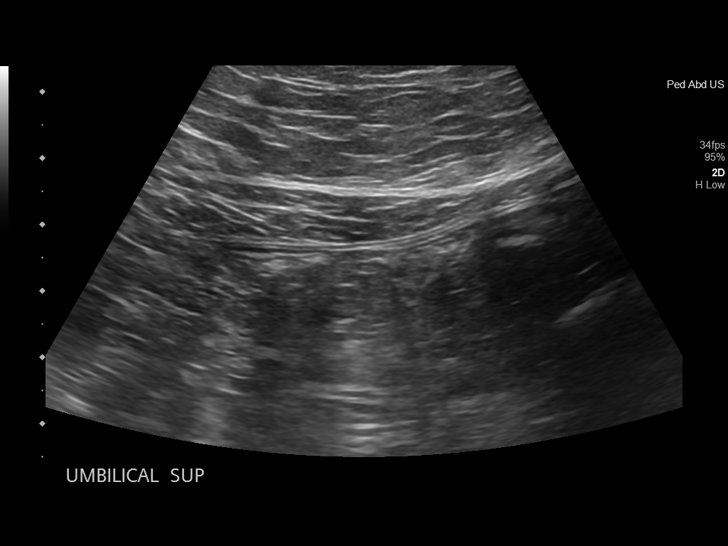
[im 4/17]
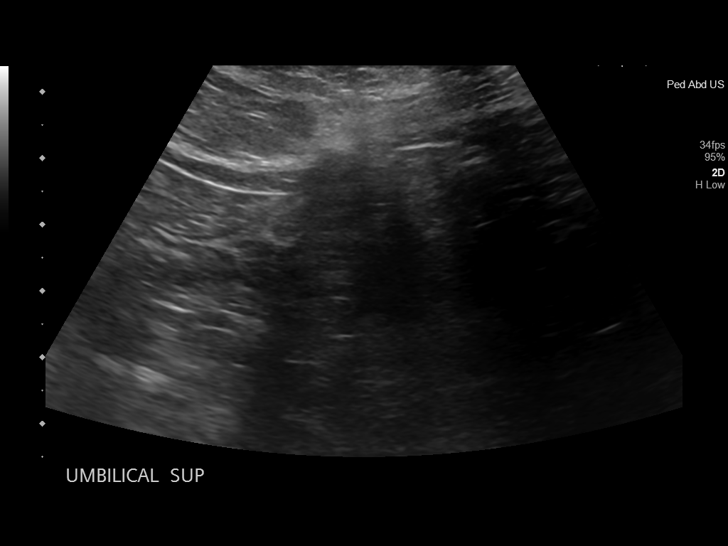
[im 5/17]
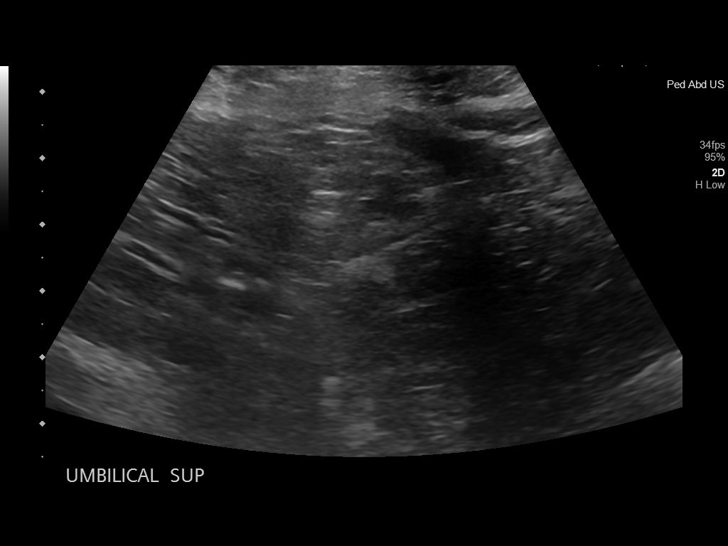
[im 6/17]
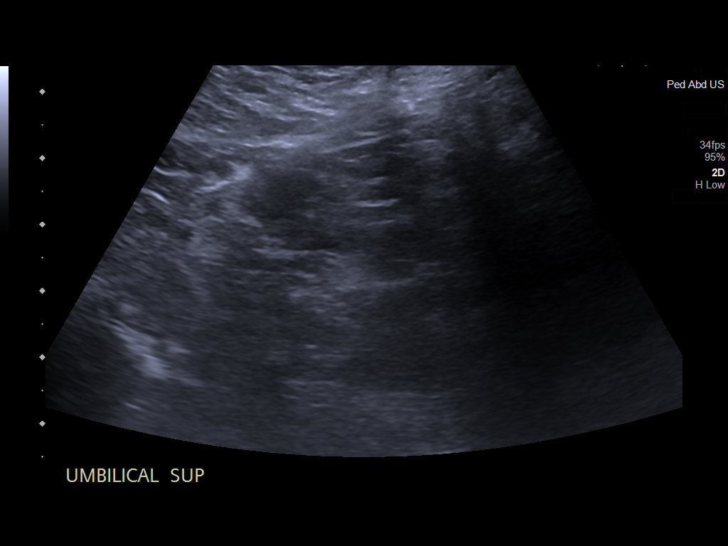
[im 7/17]
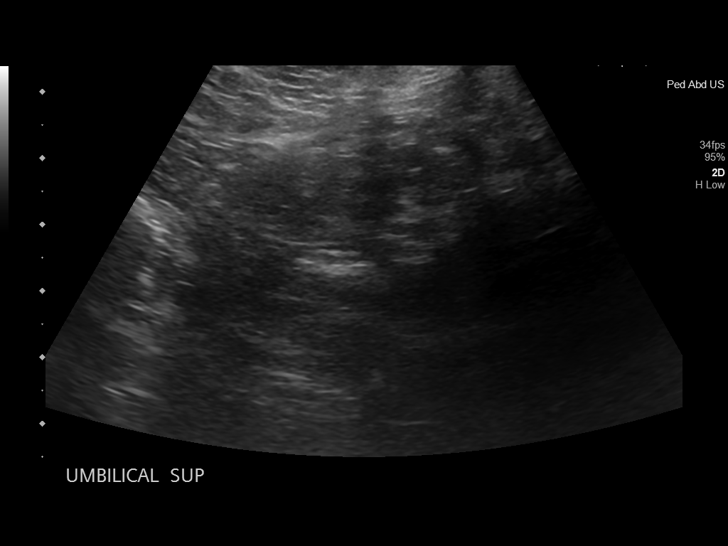
[im 8/17]
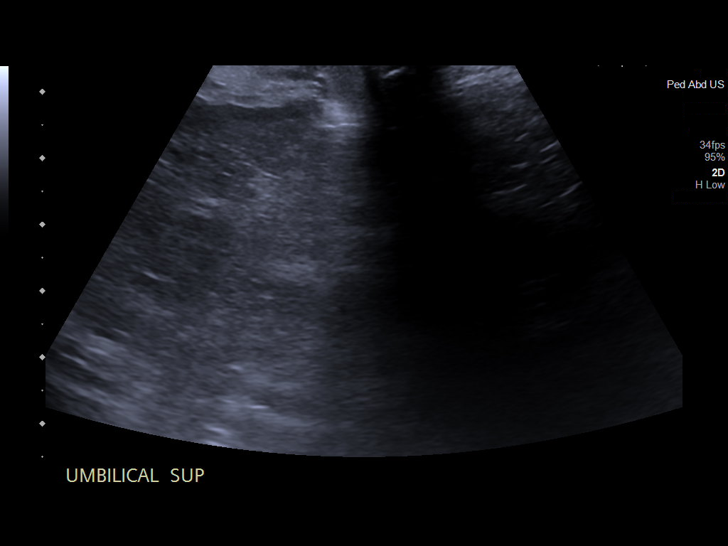
[im 10/17]
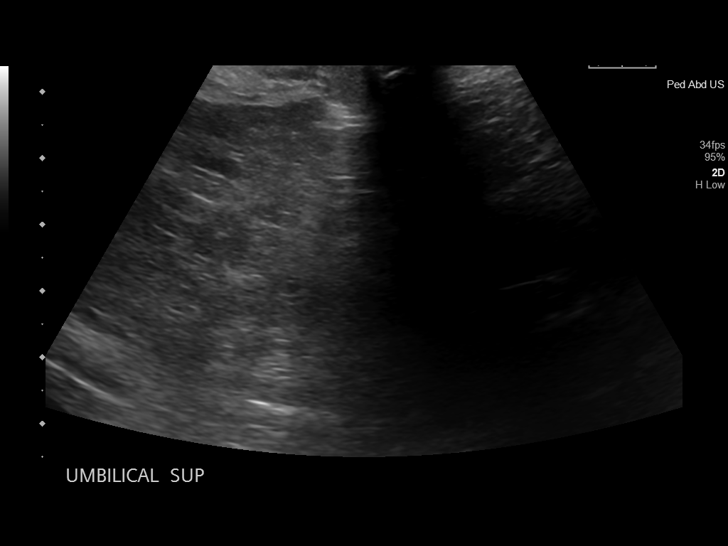
[im 11/17]
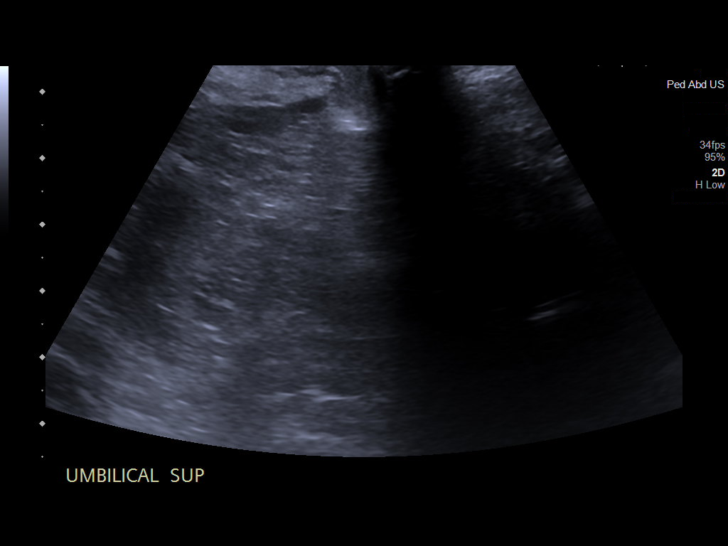
[im 12/17]
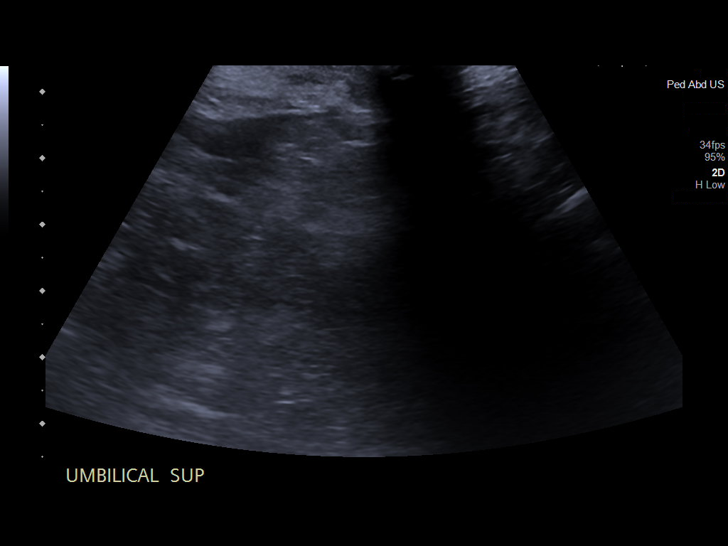
[im 13/17]
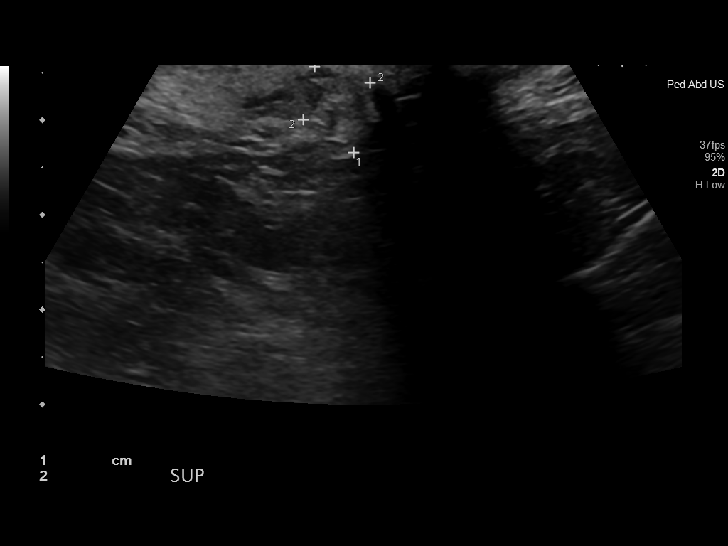
[im 14/17]
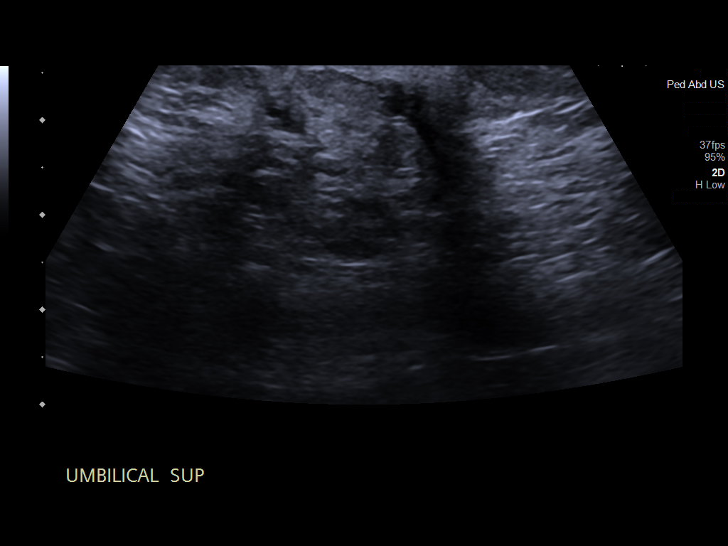
[im 16/17]
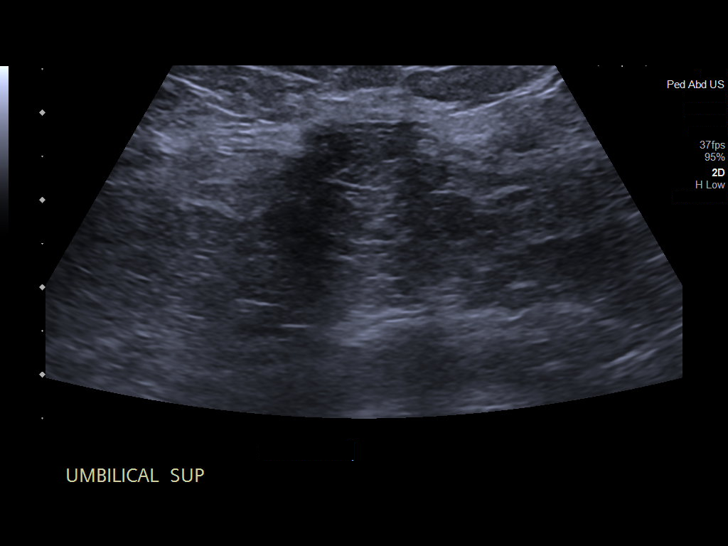
[im 17/17]
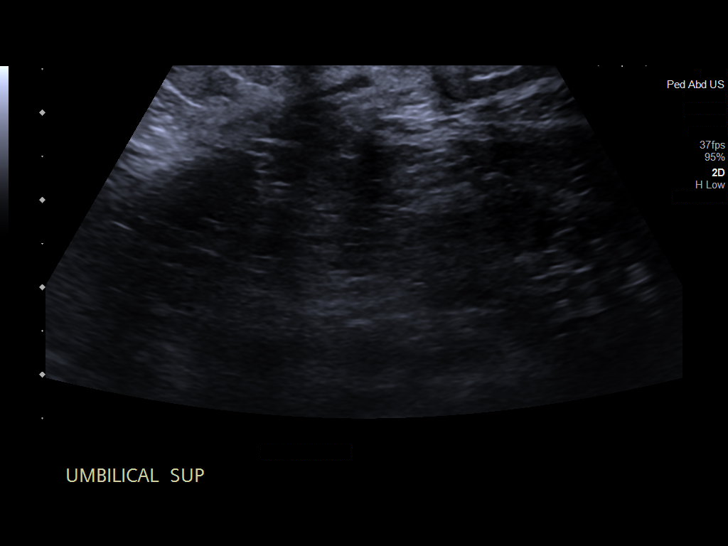

[14 of 17 positions shown; findings below may reference images not displayed]

FINDINGS: Scanning in the area of clinical concern shows evidence of a focal
herniation just superior and to the right of the umbilicus. Fat is
noted within. No bowel is identified.
IMPRESSION: Small fat containing right paraumbilical hernia. No bowel is noted
within.

## 2023-09-26 ENCOUNTER — Encounter: Payer: Self-pay | Admitting: Internal Medicine

## 2023-09-26 ENCOUNTER — Ambulatory Visit: Payer: Self-pay | Admitting: Internal Medicine

## 2023-09-26 VITALS — BP 126/76 | HR 113 | Temp 98.1°F | Resp 18 | Ht 67.0 in | Wt 248.9 lb

## 2023-09-26 DIAGNOSIS — Z7984 Long term (current) use of oral hypoglycemic drugs: Secondary | ICD-10-CM

## 2023-09-26 DIAGNOSIS — Z7985 Long-term (current) use of injectable non-insulin antidiabetic drugs: Secondary | ICD-10-CM | POA: Diagnosis not present

## 2023-09-26 DIAGNOSIS — E1165 Type 2 diabetes mellitus with hyperglycemia: Secondary | ICD-10-CM

## 2023-09-26 LAB — POCT GLYCOSYLATED HEMOGLOBIN (HGB A1C): Hemoglobin A1C: 6 % — AB (ref 4.0–5.6)

## 2023-09-26 MED ORDER — TIRZEPATIDE 5 MG/0.5ML ~~LOC~~ SOAJ: 5.0000 mg | SUBCUTANEOUS | 1 refills | Status: DC

## 2023-09-26 MED ORDER — METFORMIN HCL 1000 MG PO TABS: 1000.0000 mg | ORAL_TABLET | Freq: Every day | ORAL | 1 refills | Status: DC

## 2023-09-26 NOTE — Progress Notes (Signed)
 Established Patient Office Visit  Subjective:  Patient ID: Brian Bray, male    DOB: Nov 27, 1990  Age: 33 y.o. MRN: [id number]  CC:  Chief Complaint  Patient presents with   Medical Management of Chronic Issues   Medication Refill    HPI Brian Bray presents for follow up on chronic medical conditions.   Diabetes, Type 2: -Last A1c 1/25 6.2% -Medications: Metformin  1000 mg BID, Mounjaro  5 mg.  Overall still tolerating well  -Failed Medications: [name]  too expensive, Rybelsus  also too expensive.  -Diet: appetite decreased, completely switched over to zero sugar soda -Eye exam: UTD -Foot exam: UTD 5/24 -Microalbumin: UTD  -Statin: Not currently, tried Lipitor but didn't want to be on it anymore  -Denies symptoms of hypoglycemia, polyuria, polydipsia, numbness extremities, foot ulcers/trauma. Does endorse fatigue, cold distal extremities.   HLD: -Medications: Not on any statins currently.  Was given a prescription for Lipitor, however the patient did not pick this up.  He states he does not want to take medication for cholesterol at this time. -Last lipid panel: Lipid Panel     Component Value Date/Time   CHOL 113 06/27/2023 1013   TRIG 110 06/27/2023 1013   HDL 38 (L) 06/27/2023 1013   CHOLHDL 3.0 06/27/2023 1013   LDLCALC 55 06/27/2023 1013   Health Maintenance: -Blood work UTD  No past medical history on file.  Past Surgical History:  Procedure Laterality Date   TONSILLECTOMY AND ADENOIDECTOMY Bilateral     Family History  Problem Relation Age of Onset   Hypertension Brother     Social History   Socioeconomic History   Marital status: Single    Spouse name: Not on file   Number of children: Not on file   Years of education: Not on file   Highest education level: Some college, no degree  Occupational History   Not on file  Tobacco Use   Smoking status: Former    Types: Cigarettes    Passive exposure: Past   Smokeless tobacco: Current  Vaping Use    Vaping status: Former   Substances: Nicotine  Substance and Sexual Activity   Alcohol use: Yes    Comment: occasionally   Drug use: Never   Sexual activity: Yes  Other Topics Concern   Not on file  Social History Narrative   Not on file   Social Drivers of Health   Financial Resource Strain: Low Risk  (06/23/2023)   Overall Financial Resource Strain (CARDIA)    Difficulty of Paying Living Expenses: Not very hard  Food Insecurity: No Food Insecurity (06/23/2023)   Hunger Vital Sign    Worried About Running Out of Food in the Last Year: Never true    Ran Out of Food in the Last Year: Never true  Transportation Needs: No Transportation Needs (06/23/2023)   PRAPARE - Administrator, Civil Service (Medical): No    Lack of Transportation (Non-Medical): No  Physical Activity: Unknown (06/23/2023)   Exercise Vital Sign    Days of Exercise per Week: 0 days    Minutes of Exercise per Session: Not on file  Stress: No Stress Concern Present (06/23/2023)   [employer] of Occupational Health - Occupational Stress Questionnaire    Feeling of Stress : Only a little  Social Connections: Socially Isolated (06/23/2023)   Social Connection and Isolation Panel [NHANES]    Frequency of Communication with Friends and Family: More than three times a week    Frequency of Social Gatherings  with Friends and Family: More than three times a week    Attends Religious Services: Never    Active Member of Clubs or Organizations: No    Attends Engineer, structural: Not on file    Marital Status: Never married  Intimate Partner Violence: Not on file    Outpatient Medications Prior to Visit  Medication Sig Dispense Refill   metFORMIN  (GLUCOPHAGE ) 1000 MG tablet Take 1 tablet (1,000 mg total) by mouth 2 (two) times daily with a meal. 180 tablet 1   tirzepatide  (MOUNJARO ) 5 MG/0.5ML Pen Inject 5 mg into the skin once a week. 6 mL 1   No facility-administered medications prior to visit.     No Known Allergies  ROS Review of Systems  Constitutional:  Negative for chills and fever.  Eyes:  Negative for visual disturbance.  Respiratory:  Negative for cough and shortness of breath.   Cardiovascular:  Negative for chest pain.  Gastrointestinal:  Negative for abdominal pain, constipation, diarrhea, nausea and vomiting.      Objective:    Physical Exam Constitutional:      Appearance: Normal appearance.  HENT:     Head: Normocephalic and atraumatic.     Mouth/Throat:     Mouth: Mucous membranes are moist.     Pharynx: Oropharynx is clear.  Eyes:     Extraocular Movements: Extraocular movements intact.     Conjunctiva/sclera: Conjunctivae normal.     Pupils: Pupils are equal, round, and reactive to light.  Cardiovascular:     Rate and Rhythm: Normal rate and regular rhythm.  Pulmonary:     Effort: Pulmonary effort is normal.     Breath sounds: Normal breath sounds.  Skin:    General: Skin is warm and dry.  Neurological:     General: No focal deficit present.     Mental Status: He is alert. Mental status is at baseline.  Psychiatric:        Mood and Affect: Mood normal.        Behavior: Behavior normal.     BP 126/76   Pulse (!) 113   Temp 98.1 F (36.7 C)   Resp 18   Ht 5\' 7"  (1.702 m)   Wt 248 lb 14.4 oz (112.9 kg)   SpO2 96%   BMI 38.98 kg/m  Wt Readings from Last 3 Encounters:  09/26/23 248 lb 14.4 oz (112.9 kg)  06/27/23 251 lb (113.9 kg)  03/25/23 269 lb (122 kg)     There are no preventive care reminders to display for this patient.    There are no preventive care reminders to display for this patient.  No results found for: "TSH" Lab Results  Component Value Date   WBC 11.9 (H) 06/27/2023   HGB 16.1 06/27/2023   HCT 48.8 06/27/2023   MCV 86.5 06/27/2023   PLT 286 06/27/2023   Lab Results  Component Value Date   NA 138 06/27/2023   K 4.3 06/27/2023   CO2 30 06/27/2023   GLUCOSE 123 (H) 06/27/2023   BUN 9 06/27/2023    CREATININE 0.87 06/27/2023   BILITOT 0.9 06/27/2023   AST 17 06/27/2023   ALT 30 06/27/2023   PROT 6.8 06/27/2023   CALCIUM  9.5 06/27/2023   EGFR 118 06/27/2023   Lab Results  Component Value Date   CHOL 113 06/27/2023   Lab Results  Component Value Date   HDL 38 (L) 06/27/2023   Lab Results  Component Value Date   LDLCALC  55 06/27/2023   Lab Results  Component Value Date   TRIG 110 06/27/2023   Lab Results  Component Value Date   CHOLHDL 3.0 06/27/2023   Lab Results  Component Value Date   HGBA1C 6.2 (H) 06/27/2023      Assessment & Plan:   Assessment & Plan Type 2 Diabetes Mellitus Diabetes well-controlled with A1c of 6.0% today. Weight loss beneficial, lost about 50 pounds in the last year. Occasional hypoglycemia and brain fog likely due to medication. - Adjust metformin  to 1000 mg once daily. - Continue Mounjaro  as prescribed. - Monitor blood glucose and adjust metformin  as needed, especially on Mounjaro  days. - Encourage hydration and zero sugar electrolyte drinks for hypotension. - Follow-up in six months to reassess A1c and management.  Hernia Hernia requires caution during activities increasing intra-abdominal pressure. - Avoid exercises increasing intra-abdominal pressure, e.g., heavy squats, rapid crunches. - Encourage cautious strength training avoiding hernia strain.  General Health Maintenance Lifestyle changes, including weight loss and dietary adjustments, have improved health. - Encourage continued weight loss and dietary management. - Promote regular physical activity focusing on strength training. - Advise zero sugar beverages for a healthy diet.  - POCT HgB A1C - tirzepatide  (MOUNJARO ) 5 MG/0.5ML Pen; Inject 5 mg into the skin once a week.  Dispense: 6 mL; Refill: 1 - metFORMIN  (GLUCOPHAGE ) 1000 MG tablet; Take 1 tablet (1,000 mg total) by mouth daily with breakfast.  Dispense: 90 tablet; Refill: 1   Follow-up: Return in about 6 months  (around 03/27/2024).    Rockney Cid, DO

## 2024-03-30 ENCOUNTER — Ambulatory Visit: Admitting: Internal Medicine

## 2024-04-02 ENCOUNTER — Other Ambulatory Visit: Payer: Self-pay

## 2024-04-02 ENCOUNTER — Ambulatory Visit: Admitting: Internal Medicine

## 2024-04-02 ENCOUNTER — Encounter: Payer: Self-pay | Admitting: Internal Medicine

## 2024-04-02 VITALS — BP 120/82 | HR 98 | Temp 98.4°F | Resp 16 | Ht 67.0 in | Wt 248.0 lb

## 2024-04-02 DIAGNOSIS — Z23 Encounter for immunization: Secondary | ICD-10-CM

## 2024-04-02 DIAGNOSIS — E1165 Type 2 diabetes mellitus with hyperglycemia: Secondary | ICD-10-CM | POA: Diagnosis not present

## 2024-04-02 DIAGNOSIS — Z7984 Long term (current) use of oral hypoglycemic drugs: Secondary | ICD-10-CM

## 2024-04-02 LAB — POCT GLYCOSYLATED HEMOGLOBIN (HGB A1C): Hemoglobin A1C: 6.3 % — AB (ref 4.0–5.6)

## 2024-04-02 MED ORDER — METFORMIN HCL 1000 MG PO TABS: 1000.0000 mg | ORAL_TABLET | Freq: Every day | ORAL | 1 refills | Status: AC

## 2024-04-02 MED ORDER — TIRZEPATIDE 5 MG/0.5ML ~~LOC~~ SOAJ: 5.0000 mg | SUBCUTANEOUS | 1 refills | Status: AC

## 2024-04-02 NOTE — Progress Notes (Signed)
 Established Patient Office Visit  Subjective:  Patient ID: Brian Bray, male    DOB: [birth year]  Age: 33 y.o. MRN: [id number]  CC:  Chief Complaint  Patient presents with   Medical Management of Chronic Issues    6 month recheck    HPI Scotti Bray presents for follow up on chronic medical conditions.   Discussed the use of AI scribe software for clinical note transcription with the patient, who gave verbal consent to proceed.  History of Present Illness [name] is a 33 year old male with type 2 diabetes who presents for routine follow-up.  He manages type 2 diabetes with Mounjaro  5 mg and metformin  1000 mg daily. His recent A1c is 6.3, slightly higher than the 6.0 in April 2025, with dietary lapses due to stress but overall good control. His weight is stable at 248 pounds, consistent with the last visit. He experiences cold feet despite keeping his apartment warm at 30F, but there is no loss of sensation or other foot problems.   Diabetes, Type 2: -Last A1c 4/25 6.0% -Medications: Metformin  1000 mg BID, Mounjaro  5 mg.  Overall still tolerating well  -Failed Medications: [name]  too expensive, Rybelsus  also too expensive.  -Diet: appetite decreased, completely switched over to zero sugar soda -Eye exam: UTD -Foot exam: Due -Microalbumin: Due -Statin: Not currently, tried Lipitor but didn't want to be on it anymore  -Denies symptoms of hypoglycemia, polyuria, polydipsia, numbness extremities, foot ulcers/trauma. Does endorse fatigue, cold distal extremities.   HLD: -Medications: Not on any statins currently.  Was given a prescription for Lipitor, however the patient did not pick this up.  He states he does not want to take medication for cholesterol at this time. -Last lipid panel: Lipid Panel     Component Value Date/Time   CHOL 113 06/27/2023 1013   TRIG 110 06/27/2023 1013   HDL 38 (L) 06/27/2023 1013   CHOLHDL 3.0 06/27/2023 1013   LDLCALC 55 06/27/2023 1013    Health Maintenance: -Blood work UTD -Flu vaccine today  No past medical history on file.  Past Surgical History:  Procedure Laterality Date   TONSILLECTOMY AND ADENOIDECTOMY Bilateral     Family History  Problem Relation Age of Onset   Hypertension Brother     Social History   Socioeconomic History   Marital status: Single    Spouse name: Not on file   Number of children: Not on file   Years of education: Not on file   Highest education level: Some college, no degree  Occupational History   Not on file  Tobacco Use   Smoking status: Former    Types: Cigarettes    Passive exposure: Past   Smokeless tobacco: Current  Vaping Use   Vaping status: Former   Substances: Nicotine  Substance and Sexual Activity   Alcohol use: Yes    Comment: occasionally   Drug use: Never   Sexual activity: Yes  Other Topics Concern   Not on file  Social History Narrative   Not on file   Social Drivers of Health   Financial Resource Strain: Medium Risk (03/29/2024)   Overall Financial Resource Strain (CARDIA)    Difficulty of Paying Living Expenses: Somewhat hard  Food Insecurity: No Food Insecurity (03/29/2024)   Hunger Vital Sign    Worried About Running Out of Food in the Last Year: Never true    Ran Out of Food in the Last Year: Never true  Transportation Needs: No Transportation Needs (03/29/2024)  PRAPARE - Administrator, Civil Service (Medical): No    Lack of Transportation (Non-Medical): No  Physical Activity: Inactive (03/29/2024)   Exercise Vital Sign    Days of Exercise per Week: 0 days    Minutes of Exercise per Session: Not on file  Stress: Stress Concern Present (03/29/2024)   [employer] of Occupational Health - Occupational Stress Questionnaire    Feeling of Stress: Rather much  Social Connections: Socially Isolated (03/29/2024)   Social Connection and Isolation Panel    Frequency of Communication with Friends and Family: More than three  times a week    Frequency of Social Gatherings with Friends and Family: More than three times a week    Attends Religious Services: Never    Database Administrator or Organizations: No    Attends Engineer, Structural: Not on file    Marital Status: Never married  Intimate Partner Violence: Not on file    Outpatient Medications Prior to Visit  Medication Sig Dispense Refill   metFORMIN  (GLUCOPHAGE ) 1000 MG tablet Take 1 tablet (1,000 mg total) by mouth daily with breakfast. 90 tablet 1   tirzepatide  (MOUNJARO ) 5 MG/0.5ML Pen Inject 5 mg into the skin once a week. 6 mL 1   No facility-administered medications prior to visit.    No Known Allergies  ROS Review of Systems  All other systems reviewed and are negative.     Objective:    Physical Exam Constitutional:      Appearance: Normal appearance.  HENT:     Head: Normocephalic and atraumatic.  Eyes:     Conjunctiva/sclera: Conjunctivae normal.  Cardiovascular:     Rate and Rhythm: Normal rate and regular rhythm.     Pulses:          Dorsalis pedis pulses are 2+ on the right side and 2+ on the left side.  Pulmonary:     Effort: Pulmonary effort is normal.     Breath sounds: Normal breath sounds.  Musculoskeletal:     Right foot: Normal range of motion. No deformity, bunion, Charcot foot, foot drop or prominent metatarsal heads.     Left foot: Normal range of motion. No deformity, bunion, Charcot foot, foot drop or prominent metatarsal heads.  Feet:     Right foot:     Protective Sensation: 6 sites tested.  6 sites sensed.     Skin integrity: Skin integrity normal.     Toenail Condition: Right toenails are normal.     Left foot:     Protective Sensation: 6 sites tested.  6 sites sensed.     Skin integrity: Skin integrity normal.     Toenail Condition: Left toenails are normal.  Skin:    General: Skin is warm and dry.  Neurological:     General: No focal deficit present.     Mental Status: He is alert.  Mental status is at baseline.  Psychiatric:        Mood and Affect: Mood normal.        Behavior: Behavior normal.     BP 120/82 (Cuff Size: Large)   Pulse 98   Temp 98.4 F (36.9 C) (Oral)   Resp 16   Ht 5' 7 (1.702 m)   Wt 248 lb (112.5 kg)   SpO2 99%   BMI 38.84 kg/m  Wt Readings from Last 3 Encounters:  04/02/24 248 lb (112.5 kg)  09/26/23 248 lb 14.4 oz (112.9 kg)  06/27/23 251  lb (113.9 kg)     Health Maintenance Due  Topic Date Due   Hepatitis B Vaccines 19-59 Average Risk (1 of 3 - 19+ 3-dose series) Never done   HPV VACCINES (1 - 3-dose SCDM series) Never done   OPHTHALMOLOGY EXAM  12/24/2022   COVID-19 Vaccine (3 - 2025-26 season) 02/06/2024   Diabetic kidney evaluation - Urine ACR  03/24/2024         Topic Date Due   Hepatitis B Vaccines 19-59 Average Risk (1 of 3 - 19+ 3-dose series) Never done   HPV VACCINES (1 - 3-dose SCDM series) Never done    No results found for: TSH Lab Results  Component Value Date   WBC 11.9 (H) 06/27/2023   HGB 16.1 06/27/2023   HCT 48.8 06/27/2023   MCV 86.5 06/27/2023   PLT 286 06/27/2023   Lab Results  Component Value Date   NA 138 06/27/2023   K 4.3 06/27/2023   CO2 30 06/27/2023   GLUCOSE 123 (H) 06/27/2023   BUN 9 06/27/2023   CREATININE 0.87 06/27/2023   BILITOT 0.9 06/27/2023   AST 17 06/27/2023   ALT 30 06/27/2023   PROT 6.8 06/27/2023   CALCIUM  9.5 06/27/2023   EGFR 118 06/27/2023   Lab Results  Component Value Date   CHOL 113 06/27/2023   Lab Results  Component Value Date   HDL 38 (L) 06/27/2023   Lab Results  Component Value Date   LDLCALC 55 06/27/2023   Lab Results  Component Value Date   TRIG 110 06/27/2023   Lab Results  Component Value Date   CHOLHDL 3.0 06/27/2023   Lab Results  Component Value Date   HGBA1C 6.3 (A) 04/02/2024      Assessment & Plan:   Assessment & Plan Type 2 diabetes mellitus, controlled Diabetes well-controlled with A1c of 6.3. Adherence to  medication reported. Management satisfactory despite stress-related dietary lapses. Weight stable at 248 lbs. - Continue Mounjaro  5 mg as prescribed. - Continue Metformin  1000 mg once daily. - Refill prescriptions at [facility] on [address]. - Perform annual urine test. - Conduct foot exam today. - Schedule labs in six months.  - POCT HgB A1C - tirzepatide  (MOUNJARO ) 5 MG/0.5ML Pen; Inject 5 mg into the skin once a week.  Dispense: 6 mL; Refill: 1 - metFORMIN  (GLUCOPHAGE ) 1000 MG tablet; Take 1 tablet (1,000 mg total) by mouth daily with breakfast.  Dispense: 90 tablet; Refill: 1 - Urine Microalbumin w/creat. ratio - [facility] Diabetes Foot Exam - Flu vaccine trivalent PF, 6mos and older(Flulaval,Afluria,Fluarix,Fluzone)   Follow-up: Return in about 6 months (around 10/01/2024).    Sharyle Fischer, DO

## 2024-04-03 ENCOUNTER — Ambulatory Visit: Payer: Self-pay | Admitting: Internal Medicine

## 2024-04-03 LAB — MICROALBUMIN / CREATININE URINE RATIO
Creatinine, Urine: 221 mg/dL (ref 20–320)
Microalb Creat Ratio: 2 mg/g{creat} (ref <30)
Microalb, Ur: 0.5 mg/dL

## 2024-06-12 ENCOUNTER — Telehealth: Admitting: Nurse Practitioner

## 2024-06-12 DIAGNOSIS — J069 Acute upper respiratory infection, unspecified: Secondary | ICD-10-CM

## 2024-06-12 DIAGNOSIS — B9689 Other specified bacterial agents as the cause of diseases classified elsewhere: Secondary | ICD-10-CM | POA: Diagnosis not present

## 2024-06-12 MED ORDER — AZITHROMYCIN 250 MG PO TABS: ORAL_TABLET | ORAL | 0 refills | Status: AC

## 2024-06-12 NOTE — Progress Notes (Signed)
 We are sorry that you are not feeling well.  Here is how we plan to help!  Based on your presentation I believe you most likely have A cough due to bacteria.  When patients have a fever and a productive cough with a change in color or increased sputum production, we are concerned about bacterial bronchitis.  If left untreated it can progress to pneumonia.  If your symptoms do not improve with your treatment plan it is important that you contact your provider.   I have prescribed Azithromyin 250 mg: two tablets now and then one tablet daily for 4 additonal days    In addition you may use A non-prescription cough medication called Mucinex DM: take 2 tablets every 12 hours.    From your responses in the eVisit questionnaire you describe inflammation in the upper respiratory tract which is causing a significant cough.  This is commonly called Bronchitis and has four common causes:   Allergies Viral Infections Acid Reflux Bacterial Infection Allergies, viruses and acid reflux are treated by controlling symptoms or eliminating the cause. An example might be a cough caused by taking certain blood pressure medications. You stop the cough by changing the medication. Another example might be a cough caused by acid reflux. Controlling the reflux helps control the cough.  USE OF BRONCHODILATOR (RESCUE) INHALERS: There is a risk from using your bronchodilator too frequently.  The risk is that over-reliance on a medication which only relaxes the muscles surrounding the breathing tubes can reduce the effectiveness of medications prescribed to reduce swelling and congestion of the tubes themselves.  Although you feel brief relief from the bronchodilator inhaler, your asthma may actually be worsening with the tubes becoming more swollen and filled with mucus.  This can delay other crucial treatments, such as oral steroid medications. If you need to use a bronchodilator inhaler daily, several times per day, you  should discuss this with your provider.  There are probably better treatments that could be used to keep your asthma under control.     HOME CARE Only take medications as instructed by your medical team. Complete the entire course of an antibiotic. Drink plenty of fluids and get plenty of rest. Avoid close contacts especially the very young and the elderly Cover your mouth if you cough or cough into your sleeve. Always remember to wash your hands A steam or ultrasonic humidifier can help congestion.   GET HELP RIGHT AWAY IF: You develop worsening fever. You become short of breath You cough up blood. Your symptoms persist after you have completed your treatment plan MAKE SURE YOU  Understand these instructions. Will watch your condition. Will get help right away if you are not doing well or get worse.  Your e-visit answers were reviewed by a board certified advanced clinical practitioner to complete your personal care plan.  Depending on the condition, your plan could have included both over the counter or prescription medications. If there is a problem please reply  once you have received a response from your provider. Your safety is important to us .  If you have drug allergies check your prescription carefully.    You can use MyChart to ask questions about todays visit, request a non-urgent call back, or ask for a work or school excuse for 24 hours related to this e-Visit. If it has been greater than 24 hours you will need to follow up with your provider, or enter a new e-Visit to address those concerns. You will get  an e-mail in the next two days asking about your experience.  I hope that your e-visit has been valuable and will speed your recovery. Thank you for using e-visits.   I have spent 5 minutes in review of e-visit questionnaire, review and updating patient chart, medical decision making and response to patient.   Lauraine Kitty, FNP

## 2024-10-01 ENCOUNTER — Ambulatory Visit: Admitting: Internal Medicine
